# Patient Record
Sex: Female | Born: 1985 | Race: Black or African American | Hispanic: No | Marital: Single | Smoking: Former smoker
Health system: Southern US, Community
[De-identification: ages and names within clinical notes are randomized; demographics above are authoritative.]

## PROBLEM LIST (undated history)

## (undated) DIAGNOSIS — N83209 Unspecified ovarian cyst, unspecified side: Secondary | ICD-10-CM

## (undated) HISTORY — PX: OOPHORECTOMY: SHX86

## (undated) HISTORY — PX: INDUCED ABORTION: SHX677

---
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
## 2008-02-01 ENCOUNTER — Emergency Department: Payer: Self-pay | Admitting: Emergency Medicine

## 2009-10-04 ENCOUNTER — Emergency Department: Payer: Self-pay | Admitting: Emergency Medicine

## 2010-05-31 ENCOUNTER — Emergency Department: Payer: Self-pay | Admitting: Emergency Medicine

## 2013-08-25 ENCOUNTER — Emergency Department: Payer: Self-pay | Admitting: Emergency Medicine

## 2013-08-25 LAB — URINALYSIS, COMPLETE
BACTERIA: NONE SEEN
RBC,UR: 10347 /HPF (ref 0–5)
Specific Gravity: 1.03 (ref 1.003–1.030)
Squamous Epithelial: 95

## 2013-08-25 LAB — BASIC METABOLIC PANEL
ANION GAP: 6 — AB (ref 7–16)
BUN: 10 mg/dL (ref 7–18)
Calcium, Total: 9.6 mg/dL (ref 8.5–10.1)
Chloride: 107 mmol/L (ref 98–107)
Co2: 26 mmol/L (ref 21–32)
Creatinine: 0.93 mg/dL (ref 0.60–1.30)
EGFR (African American): >60
EGFR (Non-African Amer.): >60
Glucose: 90 mg/dL (ref 65–99)
OSMOLALITY: 276 (ref 275–301)
Potassium: 4 mmol/L (ref 3.5–5.1)
SODIUM: 139 mmol/L (ref 136–145)

## 2013-08-25 LAB — CBC
HCT: 39.9 % (ref 35.0–47.0)
HGB: 13 g/dL (ref 12.0–16.0)
MCH: 28.6 pg (ref 26.0–34.0)
MCHC: 32.6 g/dL (ref 32.0–36.0)
MCV: 88 fL (ref 80–100)
PLATELETS: 176 10*3/uL (ref 150–440)
RBC: 4.54 10*6/uL (ref 3.80–5.20)
RDW: 12.8 % (ref 11.5–14.5)
WBC: 6.7 10*3/uL (ref 3.6–11.0)

## 2013-08-25 LAB — GC/CHLAMYDIA PROBE AMP

## 2013-08-25 LAB — WET PREP, GENITAL

## 2013-08-27 LAB — URINE CULTURE

## 2014-04-15 ENCOUNTER — Emergency Department: Payer: Self-pay | Admitting: Emergency Medicine

## 2014-06-06 ENCOUNTER — Emergency Department
Admit: 2014-06-06 | Disposition: A | Discharge status: Home / Self Care | Payer: Self-pay | Admitting: Emergency Medicine

## 2014-06-06 LAB — CBC WITH DIFFERENTIAL/PLATELET
BASOS ABS: 0.1 10*3/uL (ref 0.0–0.1)
Basophil %: 0.6 %
Eosinophil #: 0.1 10*3/uL (ref 0.0–0.7)
Eosinophil %: 1.3 %
HCT: 38.8 % (ref 35.0–47.0)
HGB: 13 g/dL (ref 12.0–16.0)
LYMPHS ABS: 1.7 10*3/uL (ref 1.0–3.6)
Lymphocyte %: 19.2 %
MCH: 29.1 pg (ref 26.0–34.0)
MCHC: 33.4 g/dL (ref 32.0–36.0)
MCV: 87 fL (ref 80–100)
MONOS PCT: 5.4 %
Monocyte #: 0.5 x10 3/mm (ref 0.2–0.9)
NEUTROS PCT: 73.5 %
Neutrophil #: 6.7 10*3/uL — ABNORMAL HIGH (ref 1.4–6.5)
PLATELETS: 197 10*3/uL (ref 150–440)
RBC: 4.45 10*6/uL (ref 3.80–5.20)
RDW: 13 % (ref 11.5–14.5)
WBC: 9.1 10*3/uL (ref 3.6–11.0)

## 2014-06-06 LAB — URINALYSIS, COMPLETE
BACTERIA: NONE SEEN
BILIRUBIN, UR: NEGATIVE
Blood: NEGATIVE
Glucose,UR: NEGATIVE mg/dL (ref 0–75)
Leukocyte Esterase: NEGATIVE
Nitrite: NEGATIVE
Ph: 6 (ref 4.5–8.0)
Protein: NEGATIVE
RBC,UR: 3 /HPF (ref 0–5)
SPECIFIC GRAVITY: 1.028 (ref 1.003–1.030)
WBC UR: 1 /HPF (ref 0–5)

## 2014-06-06 LAB — WET PREP, GENITAL

## 2014-06-06 LAB — PREGNANCY, URINE: Pregnancy Test, Urine: NEGATIVE m[IU]/mL

## 2014-06-06 LAB — LIPASE, BLOOD: Lipase: 51 U/L

## 2014-06-07 LAB — COMPREHENSIVE METABOLIC PANEL
ALBUMIN: 4.4 g/dL
ALT: 16 U/L
AST: 20 U/L
Alkaline Phosphatase: 41 U/L
Anion Gap: 5 — ABNORMAL LOW (ref 7–16)
BUN: 13 mg/dL
Bilirubin,Total: 0.5 mg/dL
CHLORIDE: 103 mmol/L
Calcium, Total: 8.9 mg/dL
Co2: 25 mmol/L
Creatinine: 0.7 mg/dL
EGFR (African American): >60
Glucose: 106 mg/dL — ABNORMAL HIGH
POTASSIUM: 3.8 mmol/L
SODIUM: 133 mmol/L — AB
Total Protein: 7.8 g/dL

## 2014-06-07 LAB — GC/CHLAMYDIA PROBE AMP

## 2014-08-25 ENCOUNTER — Other Ambulatory Visit: Payer: Self-pay | Admitting: Family Medicine

## 2014-08-25 DIAGNOSIS — R102 Pelvic and perineal pain: Secondary | ICD-10-CM

## 2014-08-30 ENCOUNTER — Ambulatory Visit
Admission: RE | Admit: 2014-08-30 | Discharge: 2014-08-30 | Disposition: A | Discharge status: Home / Self Care | Payer: BC Managed Care – PPO | Source: Ambulatory Visit | Attending: Family Medicine | Admitting: Family Medicine

## 2014-08-30 DIAGNOSIS — N8320 Unspecified ovarian cysts: Secondary | ICD-10-CM | POA: Diagnosis not present

## 2014-08-30 DIAGNOSIS — R1904 Left lower quadrant abdominal swelling, mass and lump: Secondary | ICD-10-CM | POA: Diagnosis not present

## 2014-08-30 DIAGNOSIS — R102 Pelvic and perineal pain: Secondary | ICD-10-CM | POA: Diagnosis present

## 2014-09-14 ENCOUNTER — Encounter: Payer: Self-pay | Admitting: Emergency Medicine

## 2014-09-14 ENCOUNTER — Emergency Department
Admission: EM | Admit: 2014-09-14 | Discharge: 2014-09-14 | Disposition: A | Discharge status: Home / Self Care | Payer: BC Managed Care – PPO | Attending: Emergency Medicine | Admitting: Emergency Medicine

## 2014-09-14 DIAGNOSIS — N8320 Unspecified ovarian cysts: Secondary | ICD-10-CM | POA: Diagnosis not present

## 2014-09-14 DIAGNOSIS — R109 Unspecified abdominal pain: Secondary | ICD-10-CM | POA: Diagnosis present

## 2014-09-14 DIAGNOSIS — N83202 Unspecified ovarian cyst, left side: Secondary | ICD-10-CM

## 2014-09-14 HISTORY — DX: Unspecified ovarian cyst, unspecified side: N83.209

## 2014-09-14 MED ORDER — KETOROLAC TROMETHAMINE 30 MG/ML IJ SOLN
30.0000 mg | Freq: Once | INTRAMUSCULAR | Status: AC
  Administered 2014-09-14: 30 mg via INTRAVENOUS
  Filled 2014-09-14: qty 1

## 2014-09-14 MED ORDER — HYDROMORPHONE HCL 1 MG/ML IJ SOLN
1.0000 mg | Freq: Once | INTRAMUSCULAR | Status: AC
  Administered 2014-09-14: 1 mg via INTRAVENOUS
  Filled 2014-09-14: qty 1

## 2014-09-14 MED ORDER — HYDROMORPHONE HCL 1 MG/ML IJ SOLN
0.5000 mg | Freq: Once | INTRAMUSCULAR | Status: AC
  Administered 2014-09-14: 0.5 mg via INTRAVENOUS

## 2014-09-14 MED ORDER — OXYCODONE-ACETAMINOPHEN 5-325 MG PO TABS
2.0000 | ORAL_TABLET | Freq: Once | ORAL | Status: AC
  Administered 2014-09-14: 2 via ORAL

## 2014-09-14 MED ORDER — OXYCODONE-ACETAMINOPHEN 5-325 MG PO TABS
ORAL_TABLET | ORAL | Status: AC
  Filled 2014-09-14: qty 2

## 2014-09-14 MED ORDER — HYDROMORPHONE HCL 1 MG/ML IJ SOLN
INTRAMUSCULAR | Status: AC
  Administered 2014-09-14: 0.5 mg via INTRAVENOUS
  Filled 2014-09-14: qty 1

## 2014-09-14 MED ORDER — OXYCODONE-ACETAMINOPHEN 5-325 MG PO TABS: 1.0000 | ORAL_TABLET | Freq: Three times a day (TID) | ORAL | Status: AC | PRN

## 2014-09-14 MED ORDER — ONDANSETRON HCL 4 MG/2ML IJ SOLN
4.0000 mg | Freq: Once | INTRAMUSCULAR | Status: AC
  Administered 2014-09-14: 4 mg via INTRAVENOUS
  Filled 2014-09-14: qty 2

## 2014-09-14 NOTE — ED Notes (Signed)
Pt reports hx of ovarian cyst, reports US 2 weeks ago, scheduled for another on August 29th. Pt reports left lower abdominal pain; pt reports LMP was Friday, reports increased pain x3 days. Pt contacted [facility]CP and [facility], was told to come here. Pt started on tramadol with no relief.

## 2014-09-14 NOTE — ED Provider Notes (Signed)
[facility] Emergency Department Provider Note     Time seen: ----------------------------------------- 6:12 PM on 09/14/2014 -----------------------------------------    I have reviewed the triage vital signs and the nursing notes.   HISTORY  Chief Complaint Abdominal Pain    HPI Frances Kelly is a 29 y.o. female who presents ER for known ovarian cyst and persistent pelvic pain. Patient reports she knows what the problem is, she has followed up with OB/GYN since her last visit here and they are going to start her on hormones this next Sunday. Patient denies any fevers chills or other complaints. Does have some nausea associated with it. States the tramadol was not really helping that she is taking at home.   Past Medical History  Diagnosis Date  . Ovarian cyst     There are no active problems to display for this patient.   History reviewed. No pertinent past surgical history.  Allergies Review of patient's allergies indicates no known allergies.  Social History History  Substance Use Topics  . Smoking status: Never Smoker   . Smokeless tobacco: Not on file  . Alcohol Use: No    Review of Systems Constitutional: Negative for fever. Eyes: Negative for visual changes. ENT: Negative for sore throat. Cardiovascular: Negative for chest pain. Respiratory: Negative for shortness of breath. Gastrointestinal: Negative for abdominal pain, vomiting and diarrhea. Genitourinary: Negative for dysuria. Positive for pelvic pain Musculoskeletal: Negative for back pain. Skin: Negative for rash. Neurological: Negative for headaches, focal weakness or numbness.  10-point ROS otherwise negative.  ____________________________________________   PHYSICAL EXAM:  VITAL SIGNS: ED Triage Vitals  Enc Vitals Group     BP 09/14/14 1745 143/98 mmHg     Pulse Rate 09/14/14 1745 114     Resp 09/14/14 1745 16     Temp 09/14/14 1745 98.4 F (36.9 C)      Temp Source 09/14/14 1745 Oral     SpO2 09/14/14 1745 98 %     Weight 09/14/14 1745 170 lb (77.111 kg)     Height 09/14/14 1745 5\' 7"  (1.702 m)     Head Cir --      Peak Flow --      Pain Score 09/14/14 1745 10     Pain Loc --      Pain Edu? --      Excl. in GC? --     Constitutional: Alert and oriented. Anxious, well appearing and in no distress. Eyes: Conjunctivae are normal. PERRL. Normal extraocular movements. ENT   Head: Normocephalic and atraumatic.   Nose: No congestion/rhinnorhea.   Mouth/Throat: Mucous membranes are moist.   Neck: No stridor. Hematological/Lymphatic/Immunilogical: No cervical lymphadenopathy. Cardiovascular: Normal rate, regular rhythm. Normal and symmetric distal pulses are present in all extremities. No murmurs, rubs, or gallops. Respiratory: Normal respiratory effort without tachypnea nor retractions. Breath sounds are clear and equal bilaterally. No wheezes/rales/rhonchi. Gastrointestinal: Left lower quadrant and left hemipelvic tenderness. There is no CVA tenderness. Musculoskeletal: Nontender with normal range of motion in all extremities. No joint effusions.  No lower extremity tenderness nor edema. Neurologic:  Normal speech and language. No gross focal neurologic deficits are appreciated. Speech is normal. No gait instability. Skin:  Skin is warm, dry and intact. No rash noted. Psychiatric: Mood and affect are normal. Speech and behavior are normal. Patient exhibits appropriate insight and judgment.  ____________________________________________  ED COURSE:  Pertinent labs & imaging results that were available during my care of the patient were reviewed by me  and considered in my medical decision making (see chart for details). Patient will receive IV Dilaudid, Toradol, Zofran. I will review her ultrasound. ____________________________________________   RADIOLOGY Previous ultrasound results were reviewed.   ____________________________________________  FINAL ASSESSMENT AND PLAN  Ovarian cysts, chronic pelvic pain  Plan: Patient was given the above medications for pain control. Will have the patient continue with outpatient GYN follow-up. We'll increase her pain medicine to Percocets.   Emily Filbert, MD   Emily Filbert, MD 09/14/14 [id number]

## 2014-09-14 NOTE — ED Notes (Signed)
Spoke with Dr Mayford KnifeWilliams about pt's condition; verbal orders for 10mg  percocet PO.

## 2014-09-14 NOTE — ED Notes (Signed)
Pt has known ovarian cyst, pt states the pain began this afternoon, pt is to start birth control Sunday, pt tearful and anxious, rocking in bed

## 2014-09-14 NOTE — Discharge Instructions (Signed)
Ovarian Cyst An ovarian cyst is a fluid-filled sac that forms on an ovary. The ovaries are small organs that produce eggs in women. Various types of cysts can form on the ovaries. Most are not cancerous. Many do not cause problems, and they often go away on their own. Some may cause symptoms and require treatment. Common types of ovarian cysts include:  Functional cysts--These cysts may occur every month during the menstrual cycle. This is normal. The cysts usually go away with the next menstrual cycle if the woman does not get pregnant. Usually, there are no symptoms with a functional cyst.  Endometrioma cysts--These cysts form from the tissue that lines the uterus. They are also called "chocolate cysts" because they become filled with blood that turns brown. This type of cyst can cause pain in the lower abdomen during intercourse and with your menstrual period.  Cystadenoma cysts--This type develops from the cells on the outside of the ovary. These cysts can get very big and cause lower abdomen pain and pain with intercourse. This type of cyst can twist on itself, cut off its blood supply, and cause severe pain. It can also easily rupture and cause a lot of pain.  Dermoid cysts--This type of cyst is sometimes found in both ovaries. These cysts may contain different kinds of body tissue, such as skin, teeth, hair, or cartilage. They usually do not cause symptoms unless they get very big.  Theca lutein cysts--These cysts occur when too much of a certain hormone (human chorionic gonadotropin) is produced and overstimulates the ovaries to produce an egg. This is most common after procedures used to assist with the conception of a baby (in vitro fertilization). CAUSES   Fertility drugs can cause a condition in which multiple large cysts are formed on the ovaries. This is called ovarian hyperstimulation syndrome.  A condition called polycystic ovary syndrome can cause hormonal imbalances that can lead to  nonfunctional ovarian cysts. SIGNS AND SYMPTOMS  Many ovarian cysts do not cause symptoms. If symptoms are present, they may include:  Pelvic pain or pressure.  Pain in the lower abdomen.  Pain during sexual intercourse.  Increasing girth (swelling) of the abdomen.  Abnormal menstrual periods.  Increasing pain with menstrual periods.  Stopping having menstrual periods without being pregnant. DIAGNOSIS  These cysts are commonly found during a routine or annual pelvic exam. Tests may be ordered to find out more about the cyst. These tests may include:  Ultrasound.  X-ray of the pelvis.  CT scan.  MRI.  Blood tests. TREATMENT  Many ovarian cysts go away on their own without treatment. Your health care provider may want to check your cyst regularly for 2-3 months to see if it changes. For women in menopause, it is particularly important to monitor a cyst closely because of the higher rate of ovarian cancer in menopausal women. When treatment is needed, it may include any of the following:  A procedure to drain the cyst (aspiration). This may be done using a long needle and ultrasound. It can also be done through a laparoscopic procedure. This involves using a thin, lighted tube with a tiny camera on the end (laparoscope) inserted through a small incision.  Surgery to remove the whole cyst. This may be done using laparoscopic surgery or an open surgery involving a larger incision in the lower abdomen.  Hormone treatment or birth control pills. These methods are sometimes used to help dissolve a cyst. HOME CARE INSTRUCTIONS   Only take over-the-counter   or prescription medicines as directed by your health care provider.  Follow up with your health care provider as directed.  Get regular pelvic exams and Pap tests. SEEK MEDICAL CARE IF:   Your periods are late, irregular, or painful, or they stop.  Your pelvic pain or abdominal pain does not go away.  Your abdomen becomes  larger or swollen.  You have pressure on your bladder or trouble emptying your bladder completely.  You have pain during sexual intercourse.  You have feelings of fullness, pressure, or discomfort in your stomach.  You lose weight for no apparent reason.  You feel generally ill.  You become constipated.  You lose your appetite.  You develop acne.  You have an increase in body and facial hair.  You are gaining weight, without changing your exercise and eating habits.  You think you are pregnant. SEEK IMMEDIATE MEDICAL CARE IF:   You have increasing abdominal pain.  You feel sick to your stomach (nauseous), and you throw up (vomit).  You develop a fever that comes on suddenly.  You have abdominal pain during a bowel movement.  Your menstrual periods become heavier than usual. MAKE SURE YOU:  Understand these instructions.  Will watch your condition.  Will get help right away if you are not doing well or get worse. Document Released: 02/18/2005 Document Revised: 02/23/2013 Document Reviewed: 10/26/2012 Exit[employer] Patient Information 2015 [employer]. This information is not intended to replace advice given to you by your health care provider. Make sure you discuss any questions you have with your health care provider.  

## 2014-10-05 ENCOUNTER — Encounter
Admission: RE | Admit: 2014-10-05 | Discharge: 2014-10-05 | Disposition: A | Discharge status: Home / Self Care | Payer: BC Managed Care – PPO | Source: Ambulatory Visit | Attending: Obstetrics and Gynecology | Admitting: Obstetrics and Gynecology

## 2014-10-05 DIAGNOSIS — K661 Hemoperitoneum: Secondary | ICD-10-CM | POA: Diagnosis not present

## 2014-10-05 DIAGNOSIS — N8320 Unspecified ovarian cysts: Secondary | ICD-10-CM | POA: Diagnosis present

## 2014-10-05 DIAGNOSIS — N809 Endometriosis, unspecified: Secondary | ICD-10-CM | POA: Diagnosis not present

## 2014-10-05 LAB — TYPE AND SCREEN
ABO/RH(D): O POS
ANTIBODY SCREEN: NEGATIVE

## 2014-10-05 LAB — ABO/RH: ABO/RH(D): O POS

## 2014-10-05 NOTE — Patient Instructions (Addendum)
  Your procedure is scheduled on: 10/06/14 Thurs arrival time 9:40 am Report to Day Surgery. To find out your arrival time please call [phone] between 1PM - 3PM on 10/05/14 Wed.  Remember: Instructions that are not followed completely may result in serious medical risk, up to and including death, or upon the discretion of your surgeon and anesthesiologist your surgery may need to be rescheduled.    _x___ 1. Do not eat food or drink liquids after midnight. No gum chewing or hard candies.     __x__ 2. No Alcohol for 24 hours before or after surgery.   ____ 3. Bring all medications with you on the day of surgery if instructed.    __x__ 4. Notify your doctor if there is any change in your medical condition     (cold, fever, infections).     Do not wear jewelry, make-up, hairpins, clips or nail polish.  Do not wear lotions, powders, or perfumes. You may wear deodorant.  Do not shave 48 hours prior to surgery. Men may shave face and neck.  Do not bring valuables to the hospital.    [facility] is not responsible for any belongings or valuables.               Contacts, dentures or bridgework may not be worn into surgery.  Leave your suitcase in the car. After surgery it may be brought to your room.  For patients admitted to the hospital, discharge time is determined by your                treatment team.   Patients discharged the day of surgery will not be allowed to drive home.   Please read over the following fact sheets that you were given:      __x__ Take these medicines the morning of surgery with A SIP OF WATER:    1. oxyCODONE-acetaminophen (ROXICET) 5-325 MG per tablet  2.   3.   4.  5.  6.  ____ Fleet Enema (as directed)   __x__ Use CHG Soap as directed  ____ Use inhalers on the day of surgery  ____ Stop metformin 2 days prior to surgery    ____ Take 1/2 of usual insulin dose the night before surgery and none on the morning of surgery.   ____ Stop  Coumadin/Plavix/aspirin on   ____ Stop Anti-inflammatories on    ____ Stop supplements until after surgery.    ____ Bring C-Pap to the hospital.

## 2014-10-06 ENCOUNTER — Ambulatory Visit
Admission: RE | Admit: 2014-10-06 | Discharge: 2014-10-06 | Disposition: A | Discharge status: Home / Self Care | Payer: BC Managed Care – PPO | Source: Ambulatory Visit | Attending: Obstetrics and Gynecology | Admitting: Obstetrics and Gynecology

## 2014-10-06 ENCOUNTER — Ambulatory Visit: Payer: BC Managed Care – PPO | Admitting: Certified Registered Nurse Anesthetist

## 2014-10-06 ENCOUNTER — Encounter
Admission: RE | Disposition: A | Discharge status: Home / Self Care | Payer: Self-pay | Source: Ambulatory Visit | Attending: Obstetrics and Gynecology

## 2014-10-06 ENCOUNTER — Ambulatory Visit: Payer: BC Managed Care – PPO | Admitting: *Deleted

## 2014-10-06 DIAGNOSIS — N809 Endometriosis, unspecified: Secondary | ICD-10-CM | POA: Insufficient documentation

## 2014-10-06 DIAGNOSIS — K661 Hemoperitoneum: Secondary | ICD-10-CM | POA: Insufficient documentation

## 2014-10-06 HISTORY — PX: LAPAROSCOPY: SHX197

## 2014-10-06 LAB — POCT PREGNANCY, URINE: PREG TEST UR: NEGATIVE

## 2014-10-06 SURGERY — LAPAROSCOPY, DIAGNOSTIC
Anesthesia: Choice | Laterality: Left

## 2014-10-06 MED ORDER — FENTANYL CITRATE (PF) 100 MCG/2ML IJ SOLN
25.0000 ug | INTRAMUSCULAR | Status: AC | PRN
  Administered 2014-10-06 (×6): 25 ug via INTRAVENOUS

## 2014-10-06 MED ORDER — LIDOCAINE HCL (PF) 1 % IJ SOLN
INTRAMUSCULAR | Status: AC
  Administered 2014-10-06: 10:00:00
  Filled 2014-10-06: qty 2

## 2014-10-06 MED ORDER — FAMOTIDINE 20 MG PO TABS
20.0000 mg | ORAL_TABLET | Freq: Once | ORAL | Status: AC
  Administered 2014-10-06: 20 mg via ORAL

## 2014-10-06 MED ORDER — LIDOCAINE HCL (CARDIAC) 20 MG/ML IV SOLN
INTRAVENOUS | Status: DC | PRN
  Administered 2014-10-06 (×2): 100 mg via INTRAVENOUS

## 2014-10-06 MED ORDER — MEPERIDINE HCL 25 MG/ML IJ SOLN
INTRAMUSCULAR | Status: AC
  Administered 2014-10-06: 12:00:00
  Filled 2014-10-06: qty 1

## 2014-10-06 MED ORDER — FENTANYL CITRATE (PF) 100 MCG/2ML IJ SOLN
INTRAMUSCULAR | Status: AC
  Administered 2014-10-06: 25 ug via INTRAVENOUS
  Filled 2014-10-06: qty 2

## 2014-10-06 MED ORDER — DEXAMETHASONE SODIUM PHOSPHATE 4 MG/ML IJ SOLN
INTRAMUSCULAR | Status: DC | PRN
  Administered 2014-10-06: 5 mg via INTRAVENOUS

## 2014-10-06 MED ORDER — FENTANYL CITRATE (PF) 100 MCG/2ML IJ SOLN
INTRAMUSCULAR | Status: AC
Start: 2014-10-06 — End: 2014-10-06
  Administered 2014-10-06: 25 ug via INTRAVENOUS
  Filled 2014-10-06: qty 2

## 2014-10-06 MED ORDER — FENTANYL CITRATE (PF) 100 MCG/2ML IJ SOLN
INTRAMUSCULAR | Status: DC | PRN
  Administered 2014-10-06: 150 ug via INTRAVENOUS
  Administered 2014-10-06: 100 ug via INTRAVENOUS

## 2014-10-06 MED ORDER — OXYCODONE-ACETAMINOPHEN 5-325 MG PO TABS: 1.0000 | ORAL_TABLET | Freq: Four times a day (QID) | ORAL | Status: AC | PRN

## 2014-10-06 MED ORDER — ONDANSETRON HCL 4 MG/2ML IJ SOLN
INTRAMUSCULAR | Status: DC | PRN
  Administered 2014-10-06: 4 mg via INTRAVENOUS

## 2014-10-06 MED ORDER — GLYCOPYRROLATE 0.2 MG/ML IJ SOLN
INTRAMUSCULAR | Status: DC | PRN
  Administered 2014-10-06: .8 mg via INTRAVENOUS

## 2014-10-06 MED ORDER — ROCURONIUM BROMIDE 100 MG/10ML IV SOLN
INTRAVENOUS | Status: DC | PRN
  Administered 2014-10-06: 40 mg via INTRAVENOUS

## 2014-10-06 MED ORDER — NEOSTIGMINE METHYLSULFATE 10 MG/10ML IV SOLN
INTRAVENOUS | Status: DC | PRN
  Administered 2014-10-06: 5 mg via INTRAVENOUS

## 2014-10-06 MED ORDER — MIDAZOLAM HCL 2 MG/2ML IJ SOLN
INTRAMUSCULAR | Status: DC | PRN
  Administered 2014-10-06: 2 mg via INTRAVENOUS

## 2014-10-06 MED ORDER — BUPIVACAINE HCL (PF) 0.5 % IJ SOLN
INTRAMUSCULAR | Status: AC
  Filled 2014-10-06: qty 30

## 2014-10-06 MED ORDER — KETOROLAC TROMETHAMINE 30 MG/ML IJ SOLN
INTRAMUSCULAR | Status: DC | PRN
  Administered 2014-10-06: 30 mg via INTRAVENOUS

## 2014-10-06 MED ORDER — PROPOFOL 10 MG/ML IV BOLUS
INTRAVENOUS | Status: DC | PRN
  Administered 2014-10-06: 150 mg via INTRAVENOUS

## 2014-10-06 MED ORDER — BUPIVACAINE HCL 0.5 % IJ SOLN
INTRAMUSCULAR | Status: DC | PRN
  Administered 2014-10-06: 8 mL

## 2014-10-06 MED ORDER — MEPERIDINE HCL 25 MG/ML IJ SOLN
12.5000 mg | Freq: Once | INTRAMUSCULAR | Status: AC
  Administered 2014-10-06: 12.5 mg via INTRAVENOUS

## 2014-10-06 MED ORDER — LACTATED RINGERS IV SOLN
INTRAVENOUS | Status: DC
  Administered 2014-10-06: 10:00:00 via INTRAVENOUS

## 2014-10-06 MED ORDER — DOCUSATE SODIUM 100 MG PO CAPS: 100.0000 mg | ORAL_CAPSULE | Freq: Two times a day (BID) | ORAL | Status: AC

## 2014-10-06 MED ORDER — FAMOTIDINE 20 MG PO TABS
ORAL_TABLET | ORAL | Status: AC
  Filled 2014-10-06: qty 1

## 2014-10-06 SURGICAL SUPPLY — 41 items
APPLICATOR COTTON TIP 6IN STRL (MISCELLANEOUS) ×3 IMPLANT
BAG URO DRAIN 2000ML W/SPOUT (MISCELLANEOUS) ×3 IMPLANT
BLADE SURG SZ11 CARB STEEL (BLADE) ×3 IMPLANT
CANISTER SUCT 1200ML W/VALVE (MISCELLANEOUS) ×3 IMPLANT
CATH FOLEY 2WAY  5CC 16FR (CATHETERS) ×2
CATH URTH 16FR FL 2W BLN LF (CATHETERS) ×1 IMPLANT
CHLORAPREP W/TINT 26ML (MISCELLANEOUS) ×3 IMPLANT
CORD MONOPOLAR M/FML 12FT (MISCELLANEOUS) ×3 IMPLANT
DRSG TEGADERM 2-3/8X2-3/4 SM (GAUZE/BANDAGES/DRESSINGS) ×3 IMPLANT
GAUZE SPONGE NON-WVN 2X2 STRL (MISCELLANEOUS) ×1 IMPLANT
GLOVE BIO SURGEON STRL SZ7 (GLOVE) ×12 IMPLANT
GLOVE BIOGEL PI IND STRL 7.5 (GLOVE) ×4 IMPLANT
GLOVE BIOGEL PI INDICATOR 7.5 (GLOVE) ×8
GOWN STRL REUS W/ TWL LRG LVL3 (GOWN DISPOSABLE) ×1 IMPLANT
GOWN STRL REUS W/ TWL XL LVL3 (GOWN DISPOSABLE) ×1 IMPLANT
GOWN STRL REUS W/TWL LRG LVL3 (GOWN DISPOSABLE) ×2
GOWN STRL REUS W/TWL XL LVL3 (GOWN DISPOSABLE) ×2
IRRIGATION STRYKERFLOW (MISCELLANEOUS) IMPLANT
IRRIGATOR STRYKERFLOW (MISCELLANEOUS)
IV LACTATED RINGERS 1000ML (IV SOLUTION) ×3 IMPLANT
KIT RM TURNOVER CYSTO AR (KITS) ×3 IMPLANT
LABEL OR SOLS (LABEL) ×3 IMPLANT
LIGASURE 5MM LAPAROSCOPIC (INSTRUMENTS) IMPLANT
LIQUID BAND (GAUZE/BANDAGES/DRESSINGS) ×3 IMPLANT
NS IRRIG 500ML POUR BTL (IV SOLUTION) ×3 IMPLANT
PACK GYN LAPAROSCOPIC (MISCELLANEOUS) ×3 IMPLANT
PAD GROUND ADULT SPLIT (MISCELLANEOUS) ×3 IMPLANT
PAD OB MATERNITY 4.3X12.25 (PERSONAL CARE ITEMS) ×3 IMPLANT
PAD PREP 24X41 OB/GYN DISP (PERSONAL CARE ITEMS) ×3 IMPLANT
POUCH ENDO CATCH 10MM SPEC (MISCELLANEOUS) IMPLANT
SCISSORS METZENBAUM CVD 33 (INSTRUMENTS) ×3 IMPLANT
SLEEVE ENDOPATH XCEL 5M (ENDOMECHANICALS) ×6 IMPLANT
SPONGE VERSALON 2X2 STRL (MISCELLANEOUS) ×2
SUT MNCRL 4-0 (SUTURE) ×2
SUT MNCRL 4-0 27XMFL (SUTURE) ×1
SUT VICRYL 0 AB UR-6 (SUTURE) ×3 IMPLANT
SUTURE MNCRL 4-0 27XMF (SUTURE) ×1 IMPLANT
SYRINGE 10CC LL (SYRINGE) ×3 IMPLANT
TROCAR BLUNT TIP 12MM OMST12BT (TROCAR) ×3 IMPLANT
TROCAR XCEL NON-BLD 5MMX100MML (ENDOMECHANICALS) ×3 IMPLANT
TUBING INSUFFLATOR HI FLOW (MISCELLANEOUS) ×3 IMPLANT

## 2014-10-06 NOTE — Pre-Procedure Instructions (Signed)
Pt reports abdominal pain is down to a 1/10.

## 2014-10-06 NOTE — Op Note (Signed)
Operative Note   10/06/2014  PRE-OP DIAGNOSIS: Left lower quadrant pain. 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 Ruptured left ovarian cyst. +hemoperitoneum. Endometriosis   SURGEON: Surgeon(s) and Role:    * Coleta Bing, MD - Primary  ASSISTANT:   Vena Austria, MD   PROCEDURE: Diagnostic laparoscopy  ANESTHESIA: General and local  ESTIMATED BLOOD LOSS: 5mL  DRAINS: indwelling foley UOP   TOTAL IV FLUIDS: crystalloid  VTE PROPHYLAXIS: SCDs to the bilateral lower extremities  ANTIBIOTICS: not indicated  SPECIMENS: none  DISPOSITION: PACU - hemodynamically stable.  CONDITION: stable  COMPLICATIONS: None  FINDINGS: Normal EGBUS, vagina and cervix, with uterus sounding to 8.5cm. On diagnostic laparoscopy, normal appendix, liver and stomach edge and no intra-abdominal adhesions noted except for colon adhesions to the left pelvic brim, in the area of the left IP ligament. Grossly normal uterus and bilateral tubes and ovaries and the anterior cul de sac and the bilateral para-ovarian fossae. In the posterior cul de sac was approximately 50mL of old blood. On the left ovary was an approximately 0.5cm opening into the ovary (hemostatic). Adhesions and slight shortening and distortion was seen in the posterior cul de sac in the area of the utero-sacrals.    DESCRIPTION OF PROCEDURE: After informed consent was obtained, the patient was taken to the operating room where anesthesia was obtained without difficulty. The patient was positioned in the dorsal lithotomy position in [place] stirrups and her arms were carefully tucked at her sides and the usual precautions were taken.  She was prepped and draped in normal sterile fashion.  Time-out was performed and a Foley catheter was placed into the bladder. A standard Hulka uterine manipulator was then placed in the uterus without incident. Gloves were then changed, and after injection of local  anesthesia, the open technique was used to place an infraumbilical 12-mm baloon trocar under direct visualization. The laparoscope was introduced and CO2 gas was infused for pneumoperitoneum to a pressure of 15 mm Hg and the area below inspected for injury.  The patient was placed in Trendelenburg and the bowel was displaced up into the upper abdomen, and the right and left lateral 5-mm ports were placed under direct visualization of the laparoscope, after injection of local anesthesia.  The above noted findings were observed and the old blood removed with suction.  The right and left lateral ports were then removed under direct visualization and the umbilical port removed after releasing the pneumoperitneum.  The fascia at the umbilicus was closed with a figure of eight 0-vicryl stitch and the skin closed with 4-0 monocryl in a subcuticular fashion. The remaining skin incisions were closed with Liqui-band glue.  The patient tolerated the procedure well.  Sponge, lap and needle counts were correct x2.  The patient was taken to recovery room in excellent condition.  Cornelia Copa MD [facility]  Pager: [phone]

## 2014-10-06 NOTE — Anesthesia Procedure Notes (Signed)
Procedure Name: Intubation Date/Time: 10/06/2014 12:30 PM Performed by: Shirlee Limerick, Myli Pae Pre-anesthesia Checklist: Patient identified Patient Re-evaluated:Patient Re-evaluated prior to inductionOxygen Delivery Method: Circle system utilized Preoxygenation: Pre-oxygenation with 100% oxygen Intubation Type: IV induction Laryngoscope Size: Mac and 3 Grade View: Grade I Tube type: Oral Tube size: 7.0 mm Number of attempts: 1 Secured at: 21 cm Tube secured with: Tape Dental Injury: Teeth and Oropharynx as per pre-operative assessment

## 2014-10-06 NOTE — Transfer of Care (Signed)
Immediate Anesthesia Transfer of Care Note  Patient: Frances Kelly  Procedure(s) Performed: Procedure(s): LAPAROSCOPY DIAGNOSTIC (Left)  Patient Location: PACU  Anesthesia Type:General  Level of Consciousness: sedated  Airway & Oxygen Therapy: Patient Spontanous Breathing and Patient connected to nasal cannula oxygen  Post-op Assessment: Report given to RN and Post -op Vital signs reviewed and stable  Post vital signs: Reviewed and stable  Last Vitals:  Filed Vitals:   10/06/14 1345  BP: 126/81  Pulse: 75  Temp: 36.2 C  Resp: 13    Complications: No apparent anesthesia complications

## 2014-10-06 NOTE — H&P (Signed)
GYN H&P H&P reviewed and patient with pain still and will proceed with l/s left ovarian cystectomy. Will proceed when OR is ready.  Cornelia Copa MD [facility]  Pager: [phone]

## 2014-10-06 NOTE — Discharge Summary (Signed)
Gynecology Discharge Summary Date of Admission: 10/06/2014 Date of Discharge: 10/06/2014  The patient was admitted, as scheduled, and underwent a diagnostic laparoscopy for LLQ and left ovarian cyst, with rupture noted prior to the surgery during laparoscopy; please refer to operative note for full details.  She was meeting all post op goals and discharged to home from the PACU    Medication List    TAKE these medications        docusate sodium 100 MG capsule  Commonly known as:  COLACE  Take 1 capsule (100 mg total) by mouth 2 (two) times daily.     levonorgestrel-ethinyl estradiol 0.15-0.03 MG tablet  Commonly known as:  SEASONALE,INTROVALE,JOLESSA  Take 1 tablet by mouth daily.     oxyCODONE-acetaminophen 5-325 MG per tablet  Commonly known as:  ROXICET  Take 1 tablet by mouth every 8 (eight) hours as needed.     oxyCODONE-acetaminophen 5-325 MG per tablet  Commonly known as:  ROXICET  Take 1 tablet by mouth every 6 (six) hours as needed for severe pain.      #15 additonial percocet given  The patient was instructed to call for a 3-4 weeks with Dr. Cindy Hazy. MD [facility]

## 2014-10-06 NOTE — Discharge Instructions (Addendum)
[facility] Laparoscopic Surgery Discharge Instructions  Instructions Following Laparoscopic Surgery You have just undergone a major laparoscopic surgery.  The following list should answer your most common questions.  Although we will discuss your surgery and post-operative instructions with you prior to your discharge, this list will serve as a reminder if you fail to recall the details of what we discussed.  We will discuss your surgery once again in detail at your post-op visit in two to four weeks. If you havent already done so, please call to make your appointment as soon as possible.  How you will feel: Although you have just undergone a major surgery, your recovery will be significantly shorter since the surgery was performed through much smaller incisions than the traditional approach.  You should feel slightly better each day.  If you suddenly feel much worse than the prior day, please call the clinic.  Its important during the early part of your recovery that you maintain some activity.  Walking is encouraged.  You will quicken your recovery by continued activity.  Incision:  Your incisions will be closed with dissolvable stitches or surgical adhesive (glue).  There may be Band-aids and/or Steri-strips covering your incisions.  If there is no drainage from the incisions you may remove the Band-aids in one to two days.  You may notice some minor bruising at the incision sites.  This is common and will resolve within several days.  Please inform us if the redness at the edges of your incision appears to be spreading.  If the skin around your incision becomes warm to the touch, or if you notice a pus-like drainage, please call the office.  Sexual Activity Following a Hysterectomy: Do not have sexual intercourse or place tampons or douches in the vagina prior to your first office visit.  We will discuss when you may resume these activities at that visit.    Stairs/Driving/Activities: You may  climb stairs if necessary.  If youve had general anesthesia, do not drive a car the rest of the day today.  You may begin light housework when you feel up to it, but avoid heavy lifting (more than 15-20lbs) or pushing until cleared for these activities by your physician.  Hygiene:  Do not soak your incisions.  Showers are acceptable but you may not take a bath or swim in a pool.  Cleanse your incisions daily with soap and water.  Medications:  Please resume taking any medications that you were taking prior to the surgery.  If we have prescribed any new medications for you, please take them as directed.  Constipation:  It is fairly common to experience some difficulty in moving your bowels following major surgery.  Being active will help to reduce this likelihood. A diet rich in fiber and plenty of liquids is desirable.  If you do become constipated, a mild laxative such as Miralax, Milk of Magnesia, or Metamucil, or a stool softener such as Colace, is recommended.  General Instructions: If you develop a fever of 100.5 degrees or higher, please call the office number(s) below for physician on call.    We will discuss your surgery once again in detail at your post-op visit in two to four weeks. If you havent already done so, please call to make your appointment as soon as possible.  [facility] [place]  [address]  [place], [place] [address] [place], [place] [address]  Phone: [phone] Phone: [phone]  Fax: [phone] Fax: [phone]  AMBULATORY SURGERY  DISCHARGE INSTRUCTIONS   1) The drugs that you were given will stay in your system until tomorrow so for the next 24 hours you should not:  A) Drive an automobile B) Make any legal decisions C) Drink any alcoholic beverage   2) You may resume regular meals tomorrow.  Today it is better to start with liquids and gradually work up to solid foods.  You may eat anything you prefer, but it is  better to start with liquids, then soup and crackers, and gradually work up to solid foods.   3) Please notify your doctor immediately if you have any unusual bleeding, trouble breathing, redness and pain at the surgery site, drainage, fever, or pain not relieved by medication.    4) Additional Instructions:        Please contact your physician with any problems or Same Day Surgery at [phone], Monday through Friday 6 am to 4 pm, or [place] at [facility] number at [phone].

## 2014-10-06 NOTE — Pre-Procedure Instructions (Signed)
PT C/O 3/10 left lower quadrant pain.  Called Dr. Pernell Dupre, received order for 12.5 mg Demerol IVP x 1.  Med given.

## 2014-10-07 ENCOUNTER — Encounter: Payer: Self-pay | Admitting: Obstetrics and Gynecology

## 2014-10-07 NOTE — Anesthesia Preprocedure Evaluation (Signed)
Anesthesia Evaluation  Patient identified by MRN, date of birth, ID band Patient awake    Reviewed: Allergy & Precautions, H&P , NPO status , Patient's Chart, lab work & pertinent test results, reviewed documented beta blocker date and time   Airway Mallampati: II  TM Distance: >3 FB Neck ROM: full    Dental   Pulmonary Current Smoker, former smoker,          Cardiovascular Rate:Normal     Neuro/Psych    GI/Hepatic   Endo/Other    Renal/GU      Musculoskeletal   Abdominal   Peds  Hematology   Anesthesia Other Findings   Reproductive/Obstetrics                             Anesthesia Physical Anesthesia Plan  ASA: II  Anesthesia Plan: General ETT   Post-op Pain Management:    Induction:   Airway Management Planned:   Additional Equipment:   Intra-op Plan:   Post-operative Plan:   Informed Consent: I have reviewed the patients History and Physical, chart, labs and discussed the procedure including the risks, benefits and alternatives for the proposed anesthesia with the patient or authorized representative who has indicated his/her understanding and acceptance.     Plan Discussed with: CRNA  Anesthesia Plan Comments:         Anesthesia Quick Evaluation  

## 2014-10-07 NOTE — Anesthesia Postprocedure Evaluation (Signed)
  Anesthesia Post-op Note  Patient: Frances Kelly  Procedure(s) Performed: Procedure(s): LAPAROSCOPY DIAGNOSTIC (Left)  Anesthesia type:No value filed.  Patient location: PACU  Post pain: Pain level controlled  Post assessment: Post-op Vital signs reviewed, Patient's Cardiovascular Status Stable, Respiratory Function Stable, Patent Airway and No signs of Nausea or vomiting  Post vital signs: Reviewed and stable  Last Vitals:  Filed Vitals:   10/06/14 1522  BP: 123/76  Pulse: 73  Temp:   Resp: 16    Level of consciousness: awake, alert  and patient cooperative  Complications: No apparent anesthesia complications

## 2014-10-10 ENCOUNTER — Observation Stay: Payer: BC Managed Care – PPO | Admitting: Anesthesiology

## 2014-10-10 ENCOUNTER — Observation Stay
Admission: EM | Admit: 2014-10-10 | Discharge: 2014-10-10 | Disposition: A | Discharge status: Home / Self Care | Payer: BC Managed Care – PPO | Attending: Obstetrics and Gynecology | Admitting: Obstetrics and Gynecology

## 2014-10-10 ENCOUNTER — Encounter: Payer: Self-pay | Admitting: *Deleted

## 2014-10-10 ENCOUNTER — Emergency Department: Payer: BC Managed Care – PPO

## 2014-10-10 ENCOUNTER — Encounter
Admission: EM | Disposition: A | Discharge status: Home / Self Care | Payer: Self-pay | Source: Home / Self Care | Attending: Emergency Medicine

## 2014-10-10 DIAGNOSIS — N390 Urinary tract infection, site not specified: Secondary | ICD-10-CM | POA: Insufficient documentation

## 2014-10-10 DIAGNOSIS — Z79899 Other long term (current) drug therapy: Secondary | ICD-10-CM | POA: Insufficient documentation

## 2014-10-10 DIAGNOSIS — R102 Pelvic and perineal pain: Secondary | ICD-10-CM | POA: Diagnosis not present

## 2014-10-10 DIAGNOSIS — N830 Follicular cyst of ovary: Secondary | ICD-10-CM | POA: Diagnosis not present

## 2014-10-10 DIAGNOSIS — R1032 Left lower quadrant pain: Secondary | ICD-10-CM | POA: Diagnosis not present

## 2014-10-10 DIAGNOSIS — R52 Pain, unspecified: Secondary | ICD-10-CM | POA: Diagnosis present

## 2014-10-10 DIAGNOSIS — N83202 Unspecified ovarian cyst, left side: Secondary | ICD-10-CM | POA: Diagnosis present

## 2014-10-10 DIAGNOSIS — R109 Unspecified abdominal pain: Secondary | ICD-10-CM

## 2014-10-10 DIAGNOSIS — Z87891 Personal history of nicotine dependence: Secondary | ICD-10-CM | POA: Insufficient documentation

## 2014-10-10 DIAGNOSIS — R100 Acute abdomen: Secondary | ICD-10-CM | POA: Diagnosis present

## 2014-10-10 DIAGNOSIS — Z9889 Other specified postprocedural states: Secondary | ICD-10-CM

## 2014-10-10 HISTORY — PX: LAPAROSCOPIC OOPHERECTOMY: SHX6507

## 2014-10-10 LAB — URINALYSIS COMPLETE WITH MICROSCOPIC (ARMC ONLY)
Bilirubin Urine: NEGATIVE
GLUCOSE, UA: NEGATIVE mg/dL
Ketones, ur: NEGATIVE mg/dL
Nitrite: NEGATIVE
Protein, ur: 100 mg/dL — AB
Specific Gravity, Urine: 1.023 (ref 1.005–1.030)
pH: 7 (ref 5.0–8.0)

## 2014-10-10 LAB — COMPREHENSIVE METABOLIC PANEL
ALK PHOS: 29 U/L — AB (ref 38–126)
ALT: 11 U/L — AB (ref 14–54)
ANION GAP: 8 (ref 5–15)
AST: 16 U/L (ref 15–41)
Albumin: 3.8 g/dL (ref 3.5–5.0)
BUN: 12 mg/dL (ref 6–20)
CALCIUM: 9 mg/dL (ref 8.9–10.3)
CHLORIDE: 106 mmol/L (ref 101–111)
CO2: 27 mmol/L (ref 22–32)
Creatinine, Ser: 0.89 mg/dL (ref 0.44–1.00)
GFR calc non Af Amer: >60 mL/min (ref >60)
GLUCOSE: 107 mg/dL — AB (ref 65–99)
Potassium: 4.3 mmol/L (ref 3.5–5.1)
Sodium: 141 mmol/L (ref 135–145)
Total Bilirubin: <0.1 mg/dL — ABNORMAL LOW (ref 0.3–1.2)
Total Protein: 7.6 g/dL (ref 6.5–8.1)

## 2014-10-10 LAB — TYPE AND SCREEN
ABO/RH(D): O POS
ANTIBODY SCREEN: NEGATIVE

## 2014-10-10 LAB — CBC
HEMATOCRIT: 37.6 % (ref 35.0–47.0)
HEMOGLOBIN: 13.2 g/dL (ref 12.0–16.0)
MCH: 30.7 pg (ref 26.0–34.0)
MCHC: 35.2 g/dL (ref 32.0–36.0)
MCV: 87.3 fL (ref 80.0–100.0)
Platelets: 205 10*3/uL (ref 150–440)
RBC: 4.3 MIL/uL (ref 3.80–5.20)
RDW: 12.6 % (ref 11.5–14.5)
WBC: 8 10*3/uL (ref 3.6–11.0)

## 2014-10-10 LAB — POCT PREGNANCY, URINE: Preg Test, Ur: NEGATIVE

## 2014-10-10 SURGERY — LAPAROSCOPIC OOPHERECTOMY
Anesthesia: General | Laterality: Left | Wound class: Clean Contaminated

## 2014-10-10 MED ORDER — THROMBIN 5000 UNITS EX SOLR
CUTANEOUS | Status: DC | PRN
  Administered 2014-10-10: 5000 [IU] via TOPICAL

## 2014-10-10 MED ORDER — NEOSTIGMINE METHYLSULFATE 10 MG/10ML IV SOLN
INTRAVENOUS | Status: DC | PRN
  Administered 2014-10-10: 3 mg via INTRAVENOUS

## 2014-10-10 MED ORDER — SUCCINYLCHOLINE CHLORIDE 20 MG/ML IJ SOLN
INTRAMUSCULAR | Status: DC | PRN
  Administered 2014-10-10: 100 mg via INTRAVENOUS

## 2014-10-10 MED ORDER — MIDAZOLAM HCL 2 MG/2ML IJ SOLN
INTRAMUSCULAR | Status: DC | PRN
  Administered 2014-10-10: 2 mg via INTRAVENOUS

## 2014-10-10 MED ORDER — HYDROMORPHONE HCL 1 MG/ML IJ SOLN: 0.5000 mg | INTRAMUSCULAR | Status: DC | PRN

## 2014-10-10 MED ORDER — ROCURONIUM BROMIDE 100 MG/10ML IV SOLN
INTRAVENOUS | Status: DC | PRN
  Administered 2014-10-10: 10 mg via INTRAVENOUS
  Administered 2014-10-10: 30 mg via INTRAVENOUS

## 2014-10-10 MED ORDER — HYDROCODONE-ACETAMINOPHEN 5-325 MG PO TABS: 1.0000 | ORAL_TABLET | ORAL | Status: AC | PRN

## 2014-10-10 MED ORDER — ONDANSETRON HCL 4 MG/2ML IJ SOLN
4.0000 mg | Freq: Once | INTRAMUSCULAR | Status: AC | PRN
  Administered 2014-10-10: 4 mg via INTRAVENOUS

## 2014-10-10 MED ORDER — DEXAMETHASONE SODIUM PHOSPHATE 4 MG/ML IJ SOLN
INTRAMUSCULAR | Status: DC | PRN
Start: 2014-10-10 — End: 2014-10-10
  Administered 2014-10-10: 5 mg via INTRAVENOUS

## 2014-10-10 MED ORDER — OXYCODONE-ACETAMINOPHEN 5-325 MG PO TABS
ORAL_TABLET | ORAL | Status: AC
  Administered 2014-10-10: 1 via ORAL
  Filled 2014-10-10: qty 1

## 2014-10-10 MED ORDER — SULFAMETHOXAZOLE-TRIMETHOPRIM 800-160 MG PO TABS: 1.0000 | ORAL_TABLET | Freq: Two times a day (BID) | ORAL | Status: AC

## 2014-10-10 MED ORDER — DEXTROSE 5 % IV SOLN
1.0000 g | Freq: Once | INTRAVENOUS | Status: AC
  Administered 2014-10-10: 1 g via INTRAVENOUS
  Filled 2014-10-10: qty 10

## 2014-10-10 MED ORDER — FENTANYL CITRATE (PF) 100 MCG/2ML IJ SOLN
INTRAMUSCULAR | Status: DC | PRN
  Administered 2014-10-10 (×4): 50 ug via INTRAVENOUS

## 2014-10-10 MED ORDER — ONDANSETRON 4 MG PO TBDP
4.0000 mg | ORAL_TABLET | Freq: Once | ORAL | Status: AC | PRN
  Administered 2014-10-10: 4 mg via ORAL

## 2014-10-10 MED ORDER — ONDANSETRON HCL 4 MG PO TABS: 4.0000 mg | ORAL_TABLET | Freq: Four times a day (QID) | ORAL | Status: DC | PRN

## 2014-10-10 MED ORDER — ONDANSETRON HCL 4 MG/2ML IJ SOLN
INTRAMUSCULAR | Status: AC
  Administered 2014-10-10: 4 mg via INTRAVENOUS
  Filled 2014-10-10: qty 8

## 2014-10-10 MED ORDER — OXYCODONE-ACETAMINOPHEN 5-325 MG PO TABS
1.0000 | ORAL_TABLET | Freq: Once | ORAL | Status: AC
Start: 2014-10-10 — End: 2014-10-10
  Administered 2014-10-10: 1 via ORAL

## 2014-10-10 MED ORDER — HYDROMORPHONE HCL 1 MG/ML IJ SOLN
INTRAMUSCULAR | Status: DC
Start: 2014-10-10 — End: 2014-10-10
  Filled 2014-10-10: qty 1

## 2014-10-10 MED ORDER — OXYCODONE-ACETAMINOPHEN 5-325 MG PO TABS
1.0000 | ORAL_TABLET | Freq: Once | ORAL | Status: AC
  Administered 2014-10-10: 1 via ORAL
  Filled 2014-10-10: qty 1

## 2014-10-10 MED ORDER — SILVER NITRATE-POT NITRATE 75-25 % EX MISC
CUTANEOUS | Status: DC | PRN
  Administered 2014-10-10: 1

## 2014-10-10 MED ORDER — ONDANSETRON HCL 4 MG/2ML IJ SOLN
INTRAMUSCULAR | Status: DC
Start: 2014-10-10 — End: 2014-10-10
  Filled 2014-10-10: qty 2

## 2014-10-10 MED ORDER — HYDROMORPHONE HCL 1 MG/ML IJ SOLN
1.0000 mg | Freq: Once | INTRAMUSCULAR | Status: AC
  Administered 2014-10-10: 1 mg via INTRAVENOUS
  Filled 2014-10-10: qty 1

## 2014-10-10 MED ORDER — IBUPROFEN 800 MG PO TABS: 800.0000 mg | ORAL_TABLET | Freq: Three times a day (TID) | ORAL | Status: AC | PRN

## 2014-10-10 MED ORDER — HYDROMORPHONE HCL 1 MG/ML IJ SOLN
INTRAMUSCULAR | Status: AC
  Filled 2014-10-10: qty 1

## 2014-10-10 MED ORDER — BUPIVACAINE HCL 0.5 % IJ SOLN
INTRAMUSCULAR | Status: DC | PRN
  Administered 2014-10-10: 9 mL

## 2014-10-10 MED ORDER — ONDANSETRON HCL 4 MG/2ML IJ SOLN: 4.0000 mg | Freq: Four times a day (QID) | INTRAMUSCULAR | Status: DC | PRN

## 2014-10-10 MED ORDER — THROMBIN 5000 UNITS EX SOLR
CUTANEOUS | Status: AC
Start: 2014-10-10 — End: 2014-10-10
  Filled 2014-10-10: qty 5000

## 2014-10-10 MED ORDER — LACTATED RINGERS IV SOLN
INTRAVENOUS | Status: DC
  Administered 2014-10-10: 10:00:00 via INTRAVENOUS

## 2014-10-10 MED ORDER — FENTANYL CITRATE (PF) 100 MCG/2ML IJ SOLN
INTRAMUSCULAR | Status: AC
  Filled 2014-10-10: qty 2

## 2014-10-10 MED ORDER — HYDROMORPHONE HCL 1 MG/ML IJ SOLN
1.0000 mg | INTRAMUSCULAR | Status: AC
  Administered 2014-10-10: 1 mg via INTRAVENOUS
  Filled 2014-10-10: qty 1

## 2014-10-10 MED ORDER — FENTANYL CITRATE (PF) 100 MCG/2ML IJ SOLN
25.0000 ug | INTRAMUSCULAR | Status: DC | PRN
  Administered 2014-10-10 (×4): 25 ug via INTRAVENOUS

## 2014-10-10 MED ORDER — GLYCOPYRROLATE 0.2 MG/ML IJ SOLN
INTRAMUSCULAR | Status: DC | PRN
  Administered 2014-10-10: 0.6 mg via INTRAVENOUS

## 2014-10-10 MED ORDER — HYDROMORPHONE HCL 1 MG/ML IJ SOLN
0.5000 mg | INTRAMUSCULAR | Status: AC | PRN
  Administered 2014-10-10 (×4): 0.5 mg via INTRAVENOUS

## 2014-10-10 MED ORDER — ONDANSETRON HCL 4 MG/2ML IJ SOLN
4.0000 mg | Freq: Once | INTRAMUSCULAR | Status: AC
  Administered 2014-10-10: 4 mg via INTRAVENOUS
  Filled 2014-10-10: qty 2

## 2014-10-10 MED ORDER — LIDOCAINE HCL (CARDIAC) 20 MG/ML IV SOLN
INTRAVENOUS | Status: DC | PRN
  Administered 2014-10-10: 100 mg via INTRAVENOUS

## 2014-10-10 MED ORDER — PROPOFOL 10 MG/ML IV BOLUS
INTRAVENOUS | Status: DC | PRN
  Administered 2014-10-10: 150 mg via INTRAVENOUS

## 2014-10-10 MED ORDER — OXYCODONE-ACETAMINOPHEN 5-325 MG PO TABS: 1.0000 | ORAL_TABLET | Freq: Four times a day (QID) | ORAL | Status: AC | PRN

## 2014-10-10 MED ORDER — ONDANSETRON 4 MG PO TBDP
ORAL_TABLET | ORAL | Status: AC
  Filled 2014-10-10: qty 1

## 2014-10-10 MED ORDER — ONDANSETRON HCL 4 MG/2ML IJ SOLN
INTRAMUSCULAR | Status: DC | PRN
  Administered 2014-10-10: 4 mg via INTRAVENOUS

## 2014-10-10 SURGICAL SUPPLY — 50 items
APPLICATOR SURGIFLO (MISCELLANEOUS) ×3 IMPLANT
BAG URO DRAIN 2000ML W/SPOUT (MISCELLANEOUS) ×3 IMPLANT
BLADE SURG 15 STRL LF DISP TIS (BLADE) ×1 IMPLANT
BLADE SURG 15 STRL SS (BLADE) ×2
CANISTER SUCT 1200ML W/VALVE (MISCELLANEOUS) ×3 IMPLANT
CATH FOLEY 2WAY  5CC 16FR (CATHETERS) ×2
CATH ROBINSON RED A/P 16FR (CATHETERS) ×3 IMPLANT
CATH URTH 16FR FL 2W BLN LF (CATHETERS) ×1 IMPLANT
CHLORAPREP W/TINT 26ML (MISCELLANEOUS) ×3 IMPLANT
CORD MONOPOLAR M/FML 12FT (MISCELLANEOUS) ×3 IMPLANT
DEFOGGER SCOPE WARMER CLEARIFY (MISCELLANEOUS) ×3 IMPLANT
DRESSING SURGICEL FIBRLLR 1X2 (HEMOSTASIS) IMPLANT
DRESSING TELFA 4X3 1S ST N-ADH (GAUZE/BANDAGES/DRESSINGS) ×3 IMPLANT
DRSG SURGICEL FIBRILLAR 1X2 (HEMOSTASIS)
DRSG TEGADERM 2-3/8X2-3/4 SM (GAUZE/BANDAGES/DRESSINGS) ×9 IMPLANT
ENDOPOUCH RETRIEVER 10 (MISCELLANEOUS) ×3 IMPLANT
GAUZE SPONGE NON-WVN 2X2 STRL (MISCELLANEOUS) ×2 IMPLANT
GLOVE BIO SURGEON STRL SZ7 (GLOVE) ×6 IMPLANT
GLOVE INDICATOR 7.5 STRL GRN (GLOVE) ×3 IMPLANT
GOWN STRL REUS W/ TWL LRG LVL3 (GOWN DISPOSABLE) ×1 IMPLANT
GOWN STRL REUS W/ TWL XL LVL3 (GOWN DISPOSABLE) ×1 IMPLANT
GOWN STRL REUS W/TWL LRG LVL3 (GOWN DISPOSABLE) ×2
GOWN STRL REUS W/TWL XL LVL3 (GOWN DISPOSABLE) ×2
GRASPER SUT TROCAR 14GX15 (MISCELLANEOUS) ×3 IMPLANT
IRRIGATION STRYKERFLOW (MISCELLANEOUS) IMPLANT
IRRIGATOR STRYKERFLOW (MISCELLANEOUS)
IV LACTATED RINGERS 1000ML (IV SOLUTION) ×3 IMPLANT
KIT RM TURNOVER CYSTO AR (KITS) ×3 IMPLANT
LABEL OR SOLS (LABEL) ×3 IMPLANT
LIGASURE BLUNT 5MM 37CM (INSTRUMENTS) ×3 IMPLANT
LIQUID BAND (GAUZE/BANDAGES/DRESSINGS) ×3 IMPLANT
NS IRRIG 500ML POUR BTL (IV SOLUTION) ×3 IMPLANT
PACK GYN LAPAROSCOPIC (MISCELLANEOUS) ×3 IMPLANT
PAD OB MATERNITY 4.3X12.25 (PERSONAL CARE ITEMS) ×3 IMPLANT
PAD PREP 24X41 OB/GYN DISP (PERSONAL CARE ITEMS) ×3 IMPLANT
POUCH ENDO CATCH 10MM SPEC (MISCELLANEOUS) IMPLANT
SCISSORS METZENBAUM CVD 33 (INSTRUMENTS) ×3 IMPLANT
SLEEVE ENDOPATH XCEL 5M (ENDOMECHANICALS) IMPLANT
SPOGE SURGIFLO 8M (HEMOSTASIS) ×2
SPONGE SURGIFLO 8M (HEMOSTASIS) ×1 IMPLANT
SPONGE VERSALON 2X2 STRL (MISCELLANEOUS) ×4
SUT VIC AB 4-0 PS2 18 (SUTURE) ×3 IMPLANT
SUT VICRYL 0 AB UR-6 (SUTURE) ×3 IMPLANT
SYR 50ML LL SCALE MARK (SYRINGE) ×3 IMPLANT
SYRINGE 10CC LL (SYRINGE) ×3 IMPLANT
TROCAR BLUNT TIP 12MM OMST12BT (TROCAR) IMPLANT
TROCAR XCEL NON-BLD 11X100MML (ENDOMECHANICALS) ×3 IMPLANT
TROCAR XCEL NON-BLD 5MMX100MML (ENDOMECHANICALS) ×3 IMPLANT
TROCAR XCEL UNIV SLVE 11M 100M (ENDOMECHANICALS) ×3 IMPLANT
TUBING INSUFFLATOR HI FLOW (MISCELLANEOUS) ×3 IMPLANT

## 2014-10-10 NOTE — ED Notes (Addendum)
Pt uprite on stretcher in exam room, appears uncomfortable, moaning; reports laparoscopic procedure on Thursday for left ovarian cyst but found had already ruptured; c/o persistent left lower abd pain with vag bleeding; +BS, abd soft/nondist/tender to left lower quad

## 2014-10-10 NOTE — H&P (Signed)
GYN H&P  See consult note  Cornelia Copa MD [facility]  Pager: [phone]

## 2014-10-10 NOTE — Anesthesia Preprocedure Evaluation (Signed)
Anesthesia Evaluation  Patient identified by MRN, date of birth, ID band Patient awake    Reviewed: Allergy & Precautions, NPO status , Patient's Chart, lab work & pertinent test results, reviewed documented beta blocker date and time   Airway Mallampati: II  TM Distance: >3 FB     Dental  (+) Chipped   Pulmonary former smoker,           Cardiovascular      Neuro/Psych    GI/Hepatic   Endo/Other    Renal/GU      Musculoskeletal   Abdominal   Peds  Hematology   Anesthesia Other Findings   Reproductive/Obstetrics                             Anesthesia Physical Anesthesia Plan  ASA: II  Anesthesia Plan: General   Post-op Pain Management:    Induction: Intravenous  Airway Management Planned: Oral ETT  Additional Equipment:   Intra-op Plan:   Post-operative Plan:   Informed Consent: I have reviewed the patients History and Physical, chart, labs and discussed the procedure including the risks, benefits and alternatives for the proposed anesthesia with the patient or authorized representative who has indicated his/her understanding and acceptance.     Plan Discussed with: CRNA  Anesthesia Plan Comments:         Anesthesia Quick Evaluation  

## 2014-10-10 NOTE — Anesthesia Procedure Notes (Signed)
Procedure Name: Intubation Date/Time: 10/10/2014 10:29 AM Performed by: Irving Burton Pre-anesthesia Checklist: Patient identified, Emergency Drugs available, Suction available and Patient being monitored Patient Re-evaluated:Patient Re-evaluated prior to inductionOxygen Delivery Method: Circle system utilized Preoxygenation: Pre-oxygenation with 100% oxygen Intubation Type: IV induction, Rapid sequence and Cricoid Pressure applied Laryngoscope Size: Mac and 3 Grade View: Grade II Tube type: Oral Tube size: 7.0 mm Number of attempts: 1 Airway Equipment and Method: Patient positioned with wedge pillow and Stylet Placement Confirmation: ETT inserted through vocal cords under direct vision,  positive ETCO2 and breath sounds checked- equal and bilateral Secured at: 21 cm Tube secured with: Tape Dental Injury: Teeth and Oropharynx as per pre-operative assessment

## 2014-10-10 NOTE — ED Notes (Signed)
Report given to OR.

## 2014-10-10 NOTE — ED Notes (Signed)
Pt c/o L pelvic pain. Pt had a procedure performed to remove and ovarian cyst x 5 days ago. Pt states the pain has not been controlled since the procedure. Pt last took percocet that was rx'd for her pain since Saturday.

## 2014-10-10 NOTE — Anesthesia Postprocedure Evaluation (Signed)
  Anesthesia Post-op Note  Patient: Frances Kelly  Procedure(s) Performed: Procedure(s): DIAGNOSTIC LAPAROSCOPY;  LEFT OOPHERECTOMY (Left)  Anesthesia type:General  Patient location: PACU  Post pain: Pain level controlled  Post assessment: Post-op Vital signs reviewed, Patient's Cardiovascular Status Stable, Respiratory Function Stable, Patent Airway and No signs of Nausea or vomiting  Post vital signs: Reviewed and stable  Last Vitals:  Filed Vitals:   10/10/14 1414  BP: 138/85  Pulse: 87  Temp:   Resp: 16    Level of consciousness: awake, alert  and patient cooperative  Complications: No apparent anesthesia complications

## 2014-10-10 NOTE — ED Notes (Signed)
Pt returned form [place], resting in bed quietly in no distress

## 2014-10-10 NOTE — ED Provider Notes (Signed)
[facility] Emergency Department Provider Note ____________________________________________  Time seen: Approximately 6:22 AM  I have reviewed the triage vital signs and the nursing notes.   HISTORY  Chief Complaint Post-op Problem  HPI Frances Kelly is a 29 y.o. female who is complaining of severe left lower quadrant abdominal pain. Patient states that she had a cyst in her left ovary that was causing pain. Patient's doctor decided to take her for an evaluation and patient was set up to have a laparoscopic surgery done to make sure that it wasn't only assessed and she didn't have any endometriosis since these episodes have become chronic. Patient now complaining that she is status post a surgery and for the first 3 days and felt better but then all of a sudden got much worse again this morning. Patient denies any nausea, vomiting, change in her bowels, or any other symptoms. Patient denies any cough or cold symptoms as well. She also denies any dysuria frequency or hematuria. Patient states her pain on scale of 0-10 is a 10 at this time. Nothing seems to make the pain worse or better and patient has been doubled over in pain since she arrived in the ER.   Past Medical History  Diagnosis Date  . Ovarian cyst     Patient Active Problem List   Diagnosis Date Noted  . Left ovarian cyst 10/10/2014    Past Surgical History  Procedure Laterality Date  . Induced abortion    . Laparoscopy Left 10/06/2014    Procedure: LAPAROSCOPY DIAGNOSTIC;  Surgeon: Adrian Bing, MD;  Location: [facility] ORS;  Service: Gynecology;  Laterality: Left;  . Laparoscopic oopherectomy Left 10/10/2014    Procedure: DIAGNOSTIC LAPAROSCOPY;  LEFT OOPHERECTOMY;  Surgeon: [place] Bing, MD;  Location: [facility] ORS;  Service: Gynecology;  Laterality: Left;    Current Outpatient Rx  Name  Route  Sig  Dispense  Refill  . oxyCODONE-acetaminophen (ROXICET) 5-325 MG per tablet   Oral   Take 1 tablet  by mouth every 6 (six) hours as needed for severe pain.   15 tablet   0   . docusate sodium (COLACE) 100 MG capsule   Oral   Take 1 capsule (100 mg total) by mouth 2 (two) times daily.   45 capsule   0   . HYDROcodone-acetaminophen (NORCO) 5-325 MG per tablet   Oral   Take 1 tablet by mouth every 4 (four) hours as needed for moderate pain.   24 tablet   0   . ibuprofen (ADVIL,MOTRIN) 800 MG tablet   Oral   Take 1 tablet (800 mg total) by mouth every 8 (eight) hours as needed.   30 tablet   0   . levonorgestrel-ethinyl estradiol (SEASONALE,INTROVALE,JOLESSA) 0.15-0.03 MG tablet   Oral   Take 1 tablet by mouth daily.         Marland Kitchen oxyCODONE-acetaminophen (ROXICET) 5-325 MG per tablet   Oral   Take 1 tablet by mouth every 8 (eight) hours as needed.   20 tablet   0   . oxyCODONE-acetaminophen (ROXICET) 5-325 MG per tablet   Oral   Take 1 tablet by mouth every 6 (six) hours as needed for severe pain.   15 tablet   0   . sulfamethoxazole-trimethoprim (BACTRIM DS,SEPTRA DS) 800-160 MG per tablet   Oral   Take 1 tablet by mouth 2 (two) times daily.   20 tablet   0     Allergies Review of patient's allergies indicates no  known allergies.  History reviewed. No pertinent family history.  Social History Social History  Substance Use Topics  . Smoking status: Former Smoker -- 0.25 packs/day for 10 years    Quit date: 03/06/2014  . Smokeless tobacco: Never Used  . Alcohol Use: 0.0 - 3.6 oz/week    0-2 Glasses of wine, 0-2 Cans of beer, 0-2 Shots of liquor per week     Comment: 2 a week    Review of Systems Constitutional: No fever/chills Eyes: No visual changes. ENT: No sore throat. Cardiovascular: Denies chest pain. Respiratory: Denies shortness of breath. Gastrointestinal pain in the left lower quadrant and upper abdomen. Patient also had an significant abdominal cramping. No nausea, no vomiting.  No diarrhea.  No constipation. Genitourinary: Negative for  dysuria. Musculoskeletal: Negative for back pain. Skin: Negative for rash. Neurological: Negative for headaches, focal weakness or numbness. 10-point ROS otherwise negative.  ____________________________________________   PHYSICAL EXAM:  VITAL SIGNS: ED Triage Vitals  Enc Vitals Group     BP 10/10/14 0414 162/100 mmHg     Pulse Rate 10/10/14 0414 95     Resp 10/10/14 0414 22     Temp 10/10/14 0414 97.8 F (36.6 C)     Temp Source 10/10/14 0414 Oral     SpO2 10/10/14 0414 100 %     Weight 10/10/14 0414 170 lb (77.111 kg)     Height 10/10/14 0414 5\' 7"  (1.702 m)     Head Cir --      Peak Flow --      Pain Score 10/10/14 0415 10     Pain Loc --      Pain Edu? --      Excl. in GC? --     Constitutional: Alert and oriented. Well appearing and in moderate distress secondary to her pain. Eyes: Conjunctivae are normal. PERRL. EOMI. Head: Atraumatic. Nose: No congestion/rhinnorhea. Mouth/Throat: Mucous membranes are moist.  Oropharynx non-erythematous. Neck: No stridor.   Cardiovascular: Normal rate, regular rhythm. Grossly normal heart sounds.  Good peripheral circulation. Respiratory: Normal respiratory effort.  No retractions. Lungs CTAB. Gastrointestinal: Soft and tender palpation in her left lower quadrant.. No distention. No abdominal bruits. No CVA tenderness. Patient with multiple old abdominal scars from where he previously had surgery. Musculoskeletal: No lower extremity tenderness nor edema.  No joint effusions. Neurologic:  Normal speech and language. No gross focal neurologic deficits are appreciated. No gait instability. Skin:  Skin is warm, dry and intact. No rash noted. Psychiatric: Mood and affect are normal. Speech and behavior are normal.  ____________________________________________   LABS (all labs ordered are listed, but only abnormal results are displayed)  Labs Reviewed  COMPREHENSIVE METABOLIC PANEL - Abnormal; Notable for the following:    Glucose,  Bld 107 (*)    ALT 11 (*)    Alkaline Phosphatase 29 (*)    Total Bilirubin <0.1 (*)    All other components within normal limits  URINALYSIS COMPLETEWITH MICROSCOPIC ([facility] ONLY) - Abnormal; Notable for the following:    Color, Urine RED (*)    APPearance CLOUDY (*)    Hgb urine dipstick 3+ (*)    Protein, ur 100 (*)    Leukocytes, UA 1+ (*)    Bacteria, UA FEW (*)    Squamous Epithelial / LPF 6-30 (*)    All other components within normal limits  CBC  POCT PREGNANCY, URINE  TYPE AND SCREEN  SURGICAL PATHOLOGY   ____________________________________________  EKG  None ____________________________________________  RADIOLOGY  pelvic ultrasound pending US Transvaginal Non-ob  10/10/2014   CLINICAL DATA:  Pelvic pain. Recent laparoscopic surgery for left ovarian lesion. History of endometriosis.  EXAM: TRANSABDOMINAL AND TRANSVAGINAL ULTRASOUND OF PELVIS  TECHNIQUE: Both transabdominal and transvaginal ultrasound examinations of the pelvis were performed. Transabdominal technique was performed for global imaging of the pelvis including uterus, ovaries, adnexal regions, and pelvic cul-de-sac. It was necessary to proceed with endovaginal exam following the transabdominal exam to visualize the uterus and ovaries.  COMPARISON:  08/30/2014.  FINDINGS: Uterus  Measurements: 7.8 x 5.0 x 5.7 cm. No fibroids or other mass visualized.  Endometrium  Thickness: 7.3 mm.  No focal abnormality visualized.  Right ovary  Measurements: 2.4 x 3.4 x 1.7 cm. Normal appearance/no adnexal mass.  Left ovary  Measurements: 2.8 x 2.7 x 1.6 cm. 3.9 x 4.3 x 4.2 cm complex left adnexal mass, slightly larger than prior exam. Again this could represent an endometrioma given patient's history of endometriosis. Previously identified 1.9 cm complex left ovarian cyst no longer identified.  Other findings  No free fluid.  IMPRESSION: 1. 3.9 x 4.3 x 4.2 cm complex left adnexal mass, slightly larger than prior exam. Again this  could represent endometrioma given the patient's history of endometriosis. Other etiologies of complexes cannot be excluded. 2. Previously identified 1.9 cm complex left ovarian cyst is no longer identified .   Electronically Signed   By: Maisie Fus  Register   On: 10/10/2014 08:25   US Pelvis Complete  10/10/2014   CLINICAL DATA:  Pelvic pain. Recent laparoscopic surgery for left ovarian lesion. History of endometriosis.  EXAM: TRANSABDOMINAL AND TRANSVAGINAL ULTRASOUND OF PELVIS  TECHNIQUE: Both transabdominal and transvaginal ultrasound examinations of the pelvis were performed. Transabdominal technique was performed for global imaging of the pelvis including uterus, ovaries, adnexal regions, and pelvic cul-de-sac. It was necessary to proceed with endovaginal exam following the transabdominal exam to visualize the uterus and ovaries.  COMPARISON:  08/30/2014.  FINDINGS: Uterus  Measurements: 7.8 x 5.0 x 5.7 cm. No fibroids or other mass visualized.  Endometrium  Thickness: 7.3 mm.  No focal abnormality visualized.  Right ovary  Measurements: 2.4 x 3.4 x 1.7 cm. Normal appearance/no adnexal mass.  Left ovary  Measurements: 2.8 x 2.7 x 1.6 cm. 3.9 x 4.3 x 4.2 cm complex left adnexal mass, slightly larger than prior exam. Again this could represent an endometrioma given patient's history of endometriosis. Previously identified 1.9 cm complex left ovarian cyst no longer identified.  Other findings  No free fluid.  IMPRESSION: 1. 3.9 x 4.3 x 4.2 cm complex left adnexal mass, slightly larger than prior exam. Again this could represent endometrioma given the patient's history of endometriosis. Other etiologies of complexes cannot be excluded. 2. Previously identified 1.9 cm complex left ovarian cyst is no longer identified .   Electronically Signed   By: Maisie Fus  Register   On: 10/10/2014 08:25    ____________________________________________   PROCEDURES  Procedure(s) performed: None  Critical Care performed:  No  ____________________________________________   INITIAL IMPRESSION / ASSESSMENT AND PLAN / ED COURSE  Pertinent labs & imaging results that were available during my care of the patient were reviewed by me and considered in my medical decision making (see chart for details). ----------------------------------------- 6:26 AM on 10/10/2014 -----------------------------------------  Patient's to get a pelvic ultrasound to evaluate her left lower quadrant pain. Patient was given some pain and nausea meds in the ER. ____________________________________________  ----------------------------------------- 6:59 AM on 10/10/2014 -----------------------------------------  Patient will  be given some IV Rocephin for her obvious urinary tract infection. Patient's pain level has gone down significantly with the pain medicine. Patient will need to be placed on Septra and hydrocodone and ibuprofen at home for her urinary tract infection. Patient will be signed out to Dr. Suan Halter this morning pending her ultrasound results but if that is normal patient will be sent home on those medications that I just listed. Patient also running is some IV Toradol Pardue discharge.  FINAL CLINICAL IMPRESSION(S) / ED DIAGNOSES  Final diagnoses:  Pain  Acute abdominal pain  Urinary tract infection, acute  Recent major surgery      Leona Carry, MD 10/14/14 2000

## 2014-10-10 NOTE — ED Provider Notes (Signed)
CLINICAL DATA: Pelvic pain. Recent laparoscopic surgery for left ovarian lesion. History of endometriosis.  EXAM: TRANSABDOMINAL AND TRANSVAGINAL ULTRASOUND OF PELVIS  TECHNIQUE: Both transabdominal and transvaginal ultrasound examinations of the pelvis were performed. Transabdominal technique was performed for global imaging of the pelvis including uterus, ovaries, adnexal regions, and pelvic cul-de-sac. It was necessary to proceed with endovaginal exam following the transabdominal exam to visualize the uterus and ovaries.  COMPARISON: 08/30/2014.  FINDINGS: Uterus  Measurements: 7.8 x 5.0 x 5.7 cm. No fibroids or other mass visualized.  Endometrium  Thickness: 7.3 mm. No focal abnormality visualized.  Right ovary  Measurements: 2.4 x 3.4 x 1.7 cm. Normal appearance/no adnexal mass.  Left ovary  Measurements: 2.8 x 2.7 x 1.6 cm. 3.9 x 4.3 x 4.2 cm complex left adnexal mass, slightly larger than prior exam. Again this could represent an endometrioma given patient's history of endometriosis. Previously identified 1.9 cm complex left ovarian cyst no longer identified.  Other findings  No free fluid.  IMPRESSION: 1. 3.9 x 4.3 x 4.2 cm complex left adnexal mass, slightly larger than prior exam. Again this could represent endometrioma given the patient's history of endometriosis. Other etiologies of complexes cannot be excluded. 2. Previously identified 1.9 cm complex left ovarian cyst is no longer identified .  ----------------------------------------- 8:37 AM on 10/10/2014 -----------------------------------------  I have called the on-call for [facility] obstetric[facility] to discuss ultrasound findings. Patient remainsstable. Labs reviewed, ultrasound, exam, etc with Dr. Jean Rosenthal. Also she is currently being treated for urinary tract infection. Also discussed with Dr. Vergie Living. After review of clinical data, OB recommends pain control, discharge, and  follow-up in the office this week.  Dr. Vergie Living on evaluate the patient in ER, and is decided to admit the patient  Sharyn Creamer, MD 10/10/14 [phone]

## 2014-10-10 NOTE — Op Note (Addendum)
Operative Note   10/10/2014  PRE-OP DIAGNOSIS: persistent left lower quadrant pain and left ovarian cyst   POST-OP DIAGNOSIS: persistent left lower quadrant pain   SURGEON: Surgeon(s) and Role:    * Dixon Bing, MD - Primary  ASSISTANT: none  PROCEDURE: Procedure(s): DIAGNOSTIC LAPAROSCOPY;  LEFT OOPHERECTOMY   ANESTHESIA: General and local  ESTIMATED BLOOD LOSS: 25mL  DRAINS: indwelling foley UOP   TOTAL IV FLUIDS: 1L crystalloid  VTE PROPHYLAXIS: SCDs to the bilateral lower extremities  ANTIBIOTICS: none indicated  SPECIMENS: left ovary to pathology  DISPOSITION: PACU - hemodynamically stable.  CONDITION: stable  COMPLICATIONS: none  FINDINGS: no intra-abdominal adhesions, with slight bowel to the left pelvic brim. Grossly normal uterus, tubes and ovaries. Normal ureter traced into the lower pelvis, on the left side. 25mL old blood in the posterior cul de sac. After the surgery, the left tube was inspected and noted to be within normal limits. Normal EGBUS with normal vaginal vault and slight oozing coming from the external; patient is on her menses currently  INDICATIONS: persistent LLQ pain and 4cm left ovarian cyst seen on repeat ER ultrasound today.   DESCRIPTION OF PROCEDURE: After informed consent was obtained, the patient was taken to the operating room where anesthesia was obtained without difficulty. The patient was positioned in the dorsal lithotomy position in [place] stirrups and her arms were carefully tucked at her sides and the usual precautions were taken.  She was prepped and draped in normal sterile fashion.  Time-out was performed and a Foley catheter was placed into the bladder. A standard [place] uterine manipulator was then placed in the uterus without incident. Gloves were then changed, and after injection of local anesthesia, the open technique was used to place an infraumbilical 12-mm baloon trocar under direct visualization. The laparoscope was  introduced and CO2 gas was infused for pneumoperitoneum to a pressure of 15 mm Hg and the area below inspected for injury.  The patient was placed in Trendelenburg and the bowel was displaced up into the upper abdomen, and the right and left lateral 5-mm ports were placed under direct visualization of the laparoscope, after injection of local anesthesia.    The left IP ligament was then serialized cauterized and cut with the ligasure deviced, after finding the left ureter and making a window, with the monopolar scissors in the left broad ligament.  Next the left meso-salpingx was then cauterized and burned with the Ligasure device, with care to avoid damage to the left tube. The ovary was then removed from the abdomen with the endo-catch bag.  There was slight oozing at the left broad ligament and peritoneum,after turning the pressure to .  Surgi-flo was then applied to the slight oozing and the area observed under repeat low pressure and excellent hemostasis noted.  The old blood in the abdomen was then removed and the right and left lateral ports removed under direct visualization.  Before the umbilical trocar was removed the CO2 gas was released.  The fascia there was closed with 0 vicryl suture in a figure of eight fashion, after removal of the old stitch   The skin incision at the umbilicus was closed with a subcuticular stitch of 4-0 monocryl.  The remaining skin incisions were closed with Indermil glue.  The patient tolerated the procedure well.  Sponge, lap and needle counts were correct x2.  The patient was taken to recovery room in excellent condition.  Cornelia Copa MD [facility]  Pager: [phone]

## 2014-10-10 NOTE — Transfer of Care (Signed)
Immediate Anesthesia Transfer of Care Note  Patient: Frances Kelly  Procedure(s) Performed: Procedure(s): DIAGNOSTIC LAPAROSCOPY;  LEFT OOPHERECTOMY (Left)  Patient Location: PACU  Anesthesia Type:General  Level of Consciousness: sedated  Airway & Oxygen Therapy: Patient Spontanous Breathing and Patient connected to face mask oxygen  Post-op Assessment: Report given to RN and Post -op Vital signs reviewed and stable  Post vital signs: Reviewed and stable  Last Vitals:  Filed Vitals:   10/10/14 1221  BP: 129/82  Pulse: 72  Temp:   Resp: 17    Complications: No apparent anesthesia complications

## 2014-10-10 NOTE — ED Notes (Signed)
Pt taken to the OR.

## 2014-10-10 NOTE — Consult Note (Addendum)
Obstetrics & Gynecology Consultation Note  Date of Consultation: 10/10/2014   Requesting Provider: [facility] ER  Primary OBGYN: Frances Kelly Primary Care Provider: Jerl Mina  Reason for Consultation: acute onset LLQ and left ovarian cyst  History of Present Illness: Frances Kelly is a 29 y.o. LMP: now, with the above CC. PMHx significant for nothing. Patient had 8/4 diagnostic laparoscopy for 4cm left ovarian cyst. Just diagnostic scope done b/c on surgery, left ovarian had defect and there was old blood in the belly, so presumed ruptured and normal ovaries bilaterally and no cyst. E/o endometriosis but no biopsies done.  Patient states that starting last night she had acute onset LLQ that's persisted. No n/v/fevers/chills/dysuria. +VB from period  U/s in the ER shows RV 7.8 x 5 x 5.7cm uterus, ES 7.3 and 4cm complex LO adnexal cyst but normal right ovary.  On my review, +AV flow bilaterally and cyst appears to takes up nearly entire ovary and 2cm of normal stroma seen  ROS: A 12-point review of systems was performed and negative, except as stated in the above HPI.  OBGYN History: As per HPI. OB History    No data available       Past Medical History: Past Medical History  Diagnosis Date  . Ovarian cyst     Past Surgical History: Past Surgical History  Procedure Laterality Date  . Induced abortion    . Laparoscopy Left 10/06/2014    Procedure: LAPAROSCOPY DIAGNOSTIC;  Surgeon: [place] Bing, MD;  Location: [facility] ORS;  Service: Gynecology;  Laterality: Left;    Family History:  History reviewed. No pertinent family history. Social History:  History   Social History  . Marital Status: Single    Spouse Name: N/A  . Number of Children: N/A  . Years of Education: N/A   Occupational History  . Not on file.   Social History Main Topics  . Smoking status: Former Smoker -- 0.25 packs/day for 10 years    Quit date: 03/06/2014  . Smokeless tobacco: Never Used  . Alcohol Use: 0.0 - 3.6  oz/week    0-2 Glasses of wine, 0-2 Cans of beer, 0-2 Shots of liquor per week     Comment: 2 a week  . Drug Use: No  . Sexual Activity: Yes    Birth Control/ Protection: Pill   Other Topics Concern  . Not on file   Social History Narrative    Allergy: No Known Allergies  Current Outpatient Medications:  (Not in a hospital admission)   Hospital Medications: Current Facility-Administered Medications  Medication Dose Route Frequency Provider Last Rate Last Dose  . HYDROmorphone (DILAUDID) injection 0.5 mg  0.5 mg Intravenous Q2H PRN [place] Bing, MD      . lactated ringers infusion   Intravenous Continuous [place] Bing, MD      . ondansetron (ZOFRAN) tablet 4 mg  4 mg Oral Q6H PRN [place] Bing, MD       Or  . ondansetron (ZOFRAN) injection 4 mg  4 mg Intravenous Q6H PRN [place] Bing, MD      . ondansetron (ZOFRAN-ODT) 4 MG disintegrating tablet            Current Outpatient Prescriptions  Medication Sig Dispense Refill  . oxyCODONE-acetaminophen (ROXICET) 5-325 MG per tablet Take 1 tablet by mouth every 6 (six) hours as needed for severe pain. 15 tablet 0  . docusate sodium (COLACE) 100 MG capsule Take 1 capsule (100 mg total) by mouth 2 (two) times daily. 45 capsule  0  . HYDROcodone-acetaminophen (NORCO) 5-325 MG per tablet Take 1 tablet by mouth every 4 (four) hours as needed for moderate pain. 24 tablet 0  . ibuprofen (ADVIL,MOTRIN) 800 MG tablet Take 1 tablet (800 mg total) by mouth every 8 (eight) hours as needed. 30 tablet 0  . levonorgestrel-ethinyl estradiol (SEASONALE,INTROVALE,JOLESSA) 0.15-0.03 MG tablet Take 1 tablet by mouth daily.    Marland Kitchen oxyCODONE-acetaminophen (ROXICET) 5-325 MG per tablet Take 1 tablet by mouth every 8 (eight) hours as needed. 20 tablet 0  . sulfamethoxazole-trimethoprim (BACTRIM DS,SEPTRA DS) 800-160 MG per tablet Take 1 tablet by mouth 2 (two) times daily. 20 tablet 0     Physical Exam:  Current Vital Signs 24h Vital Sign Ranges   T 97.8 F (36.6 C) Temp  Avg: 97.8 F (36.6 C)  Min: 97.8 F (36.6 C)  Max: 97.8 F (36.6 C)  BP 132/82 mmHg BP  Min: 132/82  Max: 162/100  HR 75 Pulse  Avg: 79.3  Min: 70  Max: 95  RR 20 Resp  Avg: 21  Min: 20  Max: 22  SaO2 100 % Not Delivered SpO2  Avg: 99.8 %  Min: 99 %  Max: 100 %       24 Hour I/O Current Shift I/O  Time Ins Outs       Patient Vitals for the past 24 hrs:  BP Temp Temp src Pulse Resp SpO2 Height Weight  10/10/14 0830 132/82 mmHg - - 75 - 100 % - -  10/10/14 0700 (!) 144/78 mmHg - - 70 - 99 % - -  10/10/14 0616 (!) 147/82 mmHg - - 77 20 100 % - -  10/10/14 0414 (!) 162/100 mmHg 97.8 F (36.6 C) Oral 95 (!) 22 100 % 5\' 7"  (1.702 m) 170 lb (77.111 kg)    Body mass index is 26.62 kg/(m^2). General appearance: Well nourished, well developed female. Moderate distress from pain.  Cardiovascular:Regular rate and rhythm.  No murmurs, rubs or gallops. Respiratory:  Clear to auscultation bilateral. Normal respiratory effort Abdomen: soft, nd, moderately ttp in the LLQ. Incisions c/d/i and no e/o hernias Neuro/Psych:  Normal mood and affect.  Skin:  Warm and dry.   Pelvic exam: deferred  Laboratory: Beta HCG: neg via UPT  Recent Labs Lab 10/10/14 0440  WBC 8.0  HGB 13.2  HCT 37.6  PLT 205    Recent Labs Lab 10/10/14 0440  NA 141  K 4.3  CL 106  CO2 27  BUN 12  CREATININE 0.89  CALCIUM 9.0  PROT 7.6  BILITOT <0.1*  ALKPHOS 29*  ALT 11*  AST 16  GLUCOSE 107*    Recent Labs Lab 10/05/14 0803  ABORH O POS    Imaging:  As above  Assessment: persistent LO cyst  Plan: *Given findings and on surgery, no e/o cyst (pictures from surgery reviewed with patient) I recommend removal of the LO, which I didn't do b/c ovary looked normal and her age. But given s/s and inability to identify cyst from stroma, I recommend removal, which she is amenable to after r/b/a d/w pt. Will proceed with diagnostic laparoscopy, left oophorectomy. OR is aware  and will be able to do now. Type and screen ordered. *FEN/GI: NPO. Last PO last night. *Dispo: likely home after surgery  Cornelia Copa MD [facility] Pager [phone]

## 2014-10-10 NOTE — ED Notes (Signed)
Patient transported to Ultrasound 

## 2014-10-10 NOTE — Discharge Instructions (Addendum)
[facility] Laparoscopic Surgery Discharge Instructions  Instructions Following Laparoscopic Surgery You have just undergone a major laparoscopic surgery.  The following list should answer your most common questions.  Although we will discuss your surgery and post-operative instructions with you prior to your discharge, this list will serve as a reminder if you fail to recall the details of what we discussed.  We will discuss your surgery once again in detail at your post-op visit in two to four weeks. If you havent already done so, please call to make your appointment as soon as possible.  How you will feel: Although you have just undergone a major surgery, your recovery will be significantly shorter since the surgery was performed through much smaller incisions than the traditional approach.  You should feel slightly better each day.  If you suddenly feel much worse than the prior day, please call the clinic.  Its important during the early part of your recovery that you maintain some activity.  Walking is encouraged.  You will quicken your recovery by continued activity.  Incision:  Your incisions will be closed with dissolvable stitches or surgical adhesive (glue).  There may be Band-aids and/or Steri-strips covering your incisions.  If there is no drainage from the incisions you may remove the Band-aids in one to two days.  You may notice some minor bruising at the incision sites.  This is common and will resolve within several days.  Please inform us if the redness at the edges of your incision appears to be spreading.  If the skin around your incision becomes warm to the touch, or if you notice a pus-like drainage, please call the office.  Sexual Activity Following a Hysterectomy: Do not have sexual intercourse or place tampons or douches in the vagina prior to your first office visit.  We will discuss when you may resume these activities at that visit.    Stairs/Driving/Activities: You may  climb stairs if necessary.  If youve had general anesthesia, do not drive a car the rest of the day today.  You may begin light housework when you feel up to it, but avoid heavy lifting (more than 15-20lbs) or pushing until cleared for these activities by your physician.  Hygiene:  Do not soak your incisions.  Showers are acceptable but you may not take a bath or swim in a pool.  Cleanse your incisions daily with soap and water.  Medications:  Please resume taking any medications that you were taking prior to the surgery.  If we have prescribed any new medications for you, please take them as directed.  Constipation:  It is fairly common to experience some difficulty in moving your bowels following major surgery.  Being active will help to reduce this likelihood. A diet rich in fiber and plenty of liquids is desirable.  If you do become constipated, a mild laxative such as Miralax, Milk of Magnesia, or Metamucil, or a stool softener such as Colace, is recommended.  General Instructions: If you develop a fever of 100.5 degrees or higher, please call the office number(s) below for physician on call.    We will discuss your surgery once again in detail at your post-op visit in two to four weeks. If you havent already done so, please call to make your appointment as soon as possible.  [place] [place]  [address]  [place], [place] [address] [place], [place] [address]  Phone: [phone] Phone: [phone]  Fax: [phone] Fax: [phone]  AMBULATORY SURGERY  DISCHARGE INSTRUCTIONS  1) The drugs that you were given will stay in your system until tomorrow so for the next 24 hours you should not: A) Drive an automobile B) Make any legal decisions C) Drink any alcoholic beverage  2) You may resume regular meals tomorrow.  Today it is better to start with liquids and gradually work up to solid foods. You may eat anything you prefer, but it is better to start with  liquids, then soup and crackers, and gradually work up to solid foods.  3) Please notify your doctor immediately if you have any unusual bleeding, trouble breathing, redness and pain at the surgery site, drainage, fever, or pain not relieved by medication.  Please contact your physician with any problems or Same Day Surgery at [phone], Monday through Friday 6 am to 4 pm, or  at [facility] number at [phone].

## 2014-10-10 NOTE — Discharge Summary (Addendum)
Gynecology Discharge Summary Date of Admission: 10/10/2014 Date of Discharge: 10/10/2014  The patient was admitted and underwent a repeat diagnostic laparoscopy but this time with left oophorectomy for persistent and worsening LLQ pain and left adnexal cyst (3-4cm) still seen on u/s; please refer to operative note for full details, but diagnostic laparoscopy still normal, with left ovary appearing normal, but given s/s, it was advised to patient (pre-operatively) that if it and everything else still looked normal, which it did, that I would still recommend left oophorectomy, given her persistent and worsening s/s.Marland Kitchen  She was meeting all post op goals and discharged to home from the PACU.        Medication List    TAKE these medications        docusate sodium 100 MG capsule  Commonly known as:  COLACE  Take 1 capsule (100 mg total) by mouth 2 (two) times daily.     HYDROcodone-acetaminophen 5-325 MG per tablet  Commonly known as:  NORCO  Take 1 tablet by mouth every 4 (four) hours as needed for moderate pain.     ibuprofen 800 MG tablet  Commonly known as:  ADVIL,MOTRIN  Take 1 tablet (800 mg total) by mouth every 8 (eight) hours as needed.     levonorgestrel-ethinyl estradiol 0.15-0.03 MG tablet  Commonly known as:  SEASONALE,INTROVALE,JOLESSA  Take 1 tablet by mouth daily.     oxyCODONE-acetaminophen 5-325 MG per tablet  Commonly known as:  ROXICET  Take 1 tablet by mouth every 8 (eight) hours as needed.     oxyCODONE-acetaminophen 5-325 MG per tablet  Commonly known as:  ROXICET  Take 1 tablet by mouth every 6 (six) hours as needed for severe pain.     oxyCODONE-acetaminophen 5-325 MG per tablet  Commonly known as:  ROXICET  Take 1 tablet by mouth every 6 (six) hours as needed for severe pain.     sulfamethoxazole-trimethoprim 800-160 MG per tablet  Commonly known as:  BACTRIM DS,SEPTRA DS  Take 1 tablet by mouth 2 (two) times daily.        1wk follow up already made with  Dr. Vergie Living Refilled percocet Rx with 5/325 #15  [place] Bing, Montez Hageman. MD [facility]

## 2014-10-11 LAB — SURGICAL PATHOLOGY

## 2014-10-19 ENCOUNTER — Other Ambulatory Visit: Payer: Self-pay | Admitting: Obstetrics and Gynecology

## 2014-10-19 DIAGNOSIS — R1032 Left lower quadrant pain: Secondary | ICD-10-CM

## 2014-10-21 ENCOUNTER — Ambulatory Visit
Admission: RE | Admit: 2014-10-21 | Discharge: 2014-10-21 | Disposition: A | Discharge status: Home / Self Care | Payer: BC Managed Care – PPO | Source: Ambulatory Visit | Attending: Obstetrics and Gynecology | Admitting: Obstetrics and Gynecology

## 2014-10-21 DIAGNOSIS — R1900 Intra-abdominal and pelvic swelling, mass and lump, unspecified site: Secondary | ICD-10-CM | POA: Insufficient documentation

## 2014-10-21 DIAGNOSIS — R1032 Left lower quadrant pain: Secondary | ICD-10-CM | POA: Diagnosis present

## 2014-10-21 MED ORDER — IOHEXOL 300 MG/ML  SOLN
100.0000 mL | Freq: Once | INTRAMUSCULAR | Status: AC | PRN
  Administered 2014-10-21: 100 mL via INTRAVENOUS

## 2014-10-25 ENCOUNTER — Emergency Department
Admission: EM | Admit: 2014-10-25 | Discharge: 2014-10-25 | Disposition: A | Discharge status: Other Acute Inpatient Hospital | Payer: BC Managed Care – PPO | Attending: Emergency Medicine | Admitting: Emergency Medicine

## 2014-10-25 ENCOUNTER — Emergency Department: Payer: BC Managed Care – PPO

## 2014-10-25 ENCOUNTER — Encounter: Payer: Self-pay | Admitting: Emergency Medicine

## 2014-10-25 DIAGNOSIS — Z3202 Encounter for pregnancy test, result negative: Secondary | ICD-10-CM | POA: Insufficient documentation

## 2014-10-25 DIAGNOSIS — R102 Pelvic and perineal pain: Secondary | ICD-10-CM | POA: Diagnosis not present

## 2014-10-25 DIAGNOSIS — Z87891 Personal history of nicotine dependence: Secondary | ICD-10-CM | POA: Diagnosis not present

## 2014-10-25 DIAGNOSIS — R1032 Left lower quadrant pain: Secondary | ICD-10-CM | POA: Diagnosis present

## 2014-10-25 LAB — CBC WITH DIFFERENTIAL/PLATELET
Basophils Absolute: 0.1 10*3/uL (ref 0–0.1)
Basophils Relative: 2 %
Eosinophils Absolute: 0.1 10*3/uL (ref 0–0.7)
Eosinophils Relative: 2 %
HCT: 37.2 % (ref 35.0–47.0)
HEMOGLOBIN: 12.4 g/dL (ref 12.0–16.0)
LYMPHS ABS: 1.4 10*3/uL (ref 1.0–3.6)
LYMPHS PCT: 16 %
MCH: 29 pg (ref 26.0–34.0)
MCHC: 33.3 g/dL (ref 32.0–36.0)
MCV: 87.3 fL (ref 80.0–100.0)
Monocytes Absolute: 0.3 10*3/uL (ref 0.2–0.9)
Monocytes Relative: 4 %
NEUTROS ABS: 6.5 10*3/uL (ref 1.4–6.5)
NEUTROS PCT: 76 %
Platelets: 219 10*3/uL (ref 150–440)
RBC: 4.26 MIL/uL (ref 3.80–5.20)
RDW: 12.4 % (ref 11.5–14.5)
WBC: 8.4 10*3/uL (ref 3.6–11.0)

## 2014-10-25 LAB — URINALYSIS COMPLETE WITH MICROSCOPIC (ARMC ONLY)
BACTERIA UA: NONE SEEN
Bilirubin Urine: NEGATIVE
GLUCOSE, UA: NEGATIVE mg/dL
NITRITE: NEGATIVE
Protein, ur: NEGATIVE mg/dL
SPECIFIC GRAVITY, URINE: 1.025 (ref 1.005–1.030)
pH: 6 (ref 5.0–8.0)

## 2014-10-25 LAB — PREGNANCY, URINE: Preg Test, Ur: NEGATIVE

## 2014-10-25 LAB — COMPREHENSIVE METABOLIC PANEL
ALK PHOS: 32 U/L — AB (ref 38–126)
ALT: 28 U/L (ref 14–54)
AST: 20 U/L (ref 15–41)
Albumin: 3.8 g/dL (ref 3.5–5.0)
Anion gap: 7 (ref 5–15)
BUN: 11 mg/dL (ref 6–20)
CALCIUM: 8.9 mg/dL (ref 8.9–10.3)
CO2: 25 mmol/L (ref 22–32)
CREATININE: 0.94 mg/dL (ref 0.44–1.00)
Chloride: 106 mmol/L (ref 101–111)
GFR calc Af Amer: >60 mL/min (ref >60)
Glucose, Bld: 111 mg/dL — ABNORMAL HIGH (ref 65–99)
Potassium: 3.7 mmol/L (ref 3.5–5.1)
Sodium: 138 mmol/L (ref 135–145)
Total Bilirubin: 0.2 mg/dL — ABNORMAL LOW (ref 0.3–1.2)
Total Protein: 7.2 g/dL (ref 6.5–8.1)

## 2014-10-25 MED ORDER — HYDROMORPHONE HCL 1 MG/ML IJ SOLN
1.0000 mg | Freq: Once | INTRAMUSCULAR | Status: AC
  Administered 2014-10-25: 1 mg via INTRAVENOUS
  Filled 2014-10-25: qty 1

## 2014-10-25 MED ORDER — KETOROLAC TROMETHAMINE 30 MG/ML IJ SOLN
30.0000 mg | Freq: Once | INTRAMUSCULAR | Status: AC
  Administered 2014-10-25: 30 mg via INTRAVENOUS
  Filled 2014-10-25: qty 1

## 2014-10-25 NOTE — ED Provider Notes (Addendum)
[facility] Emergency Department Provider Note     Time seen: ----------------------------------------- 9:45 AM on 10/25/2014 -----------------------------------------    I have reviewed the triage vital signs and the nursing notes.   HISTORY  Chief Complaint Abdominal Pain    HPI Frances Kelly is a 29 y.o. female who presents to ER for severe left lower quadrant pain. Patient states she recently had laparoscopic surgery and oophorectomy on the left side. Patient is complaining of severe sharp pain in the left side, is having vaginal bleeding which she has had off and on this month. She's been referred to [facility] for follow-up in that locally they are unclear what is causing her pain. She denies any fever.Her prescription pain medicine is not helping.   Past Medical History  Diagnosis Date  . Ovarian cyst     Patient Active Problem List   Diagnosis Date Noted  . Left ovarian cyst 10/10/2014    Past Surgical History  Procedure Laterality Date  . Induced abortion    . Laparoscopy Left 10/06/2014    Procedure: LAPAROSCOPY DIAGNOSTIC;  Surgeon: [place] Bing, MD;  Location: [facility] ORS;  Service: Gynecology;  Laterality: Left;  . Laparoscopic oopherectomy Left 10/10/2014    Procedure: DIAGNOSTIC LAPAROSCOPY;  LEFT OOPHERECTOMY;  Surgeon: [place] Bing, MD;  Location: [facility] ORS;  Service: Gynecology;  Laterality: Left;  . Oophorectomy      Allergies Review of patient's allergies indicates no known allergies.  Social History Social History  Substance Use Topics  . Smoking status: Former Smoker -- 0.25 packs/day for 10 years    Quit date: 03/06/2014  . Smokeless tobacco: Never Used  . Alcohol Use: 0.0 - 3.6 oz/week    0-2 Glasses of wine, 0-2 Cans of beer, 0-2 Shots of liquor per week     Comment: 2 a week    Review of Systems Constitutional: Negative for fever. Eyes: Negative for visual changes. ENT: Negative for sore throat. Cardiovascular:  Negative for chest pain. Respiratory: Negative for shortness of breath. Gastrointestinal: Positive for abdominal pain Genitourinary: Negative for dysuria. Musculoskeletal: Negative for back pain. Skin: Negative for rash. Neurological: Negative for headaches, focal weakness or numbness.  10-point ROS otherwise negative.  ____________________________________________   PHYSICAL EXAM:  VITAL SIGNS: ED Triage Vitals  Enc Vitals Group     BP 10/25/14 0940 150/93 mmHg     Pulse Rate 10/25/14 0937 99     Resp 10/25/14 0937 24     Temp --      Temp src --      SpO2 10/25/14 0937 100 %     Weight 10/25/14 0937 170 lb (77.111 kg)     Height 10/25/14 0937 5\' 7"  (1.702 m)     Head Cir --      Peak Flow --      Pain Score 10/25/14 0940 10     Pain Loc --      Pain Edu? --      Excl. in GC? --     Constitutional: Alert and oriented. Mild distress, tearful Eyes: Conjunctivae are normal. PERRL. Normal extraocular movements. ENT   Head: Normocephalic and atraumatic.   Nose: No congestion/rhinnorhea.   Mouth/Throat: Mucous membranes are moist.   Neck: No stridor. Cardiovascular: Normal rate, regular rhythm. Normal and symmetric distal pulses are present in all extremities. No murmurs, rubs, or gallops. Respiratory: Normal respiratory effort without tachypnea nor retractions. Breath sounds are clear and equal bilaterally. No wheezes/rales/rhonchi. Gastrointestinal: Left lower quadrant tenderness,  no rebound or guarding. Normal bowel sounds. Musculoskeletal: Nontender with normal range of motion in all extremities. No joint effusions.  No lower extremity tenderness nor edema. Neurologic:  Normal speech and language. No gross focal neurologic deficits are appreciated. Speech is normal. No gait instability. Skin:  Skin is warm, dry and intact. No rash noted. Psychiatric: Mood and affect are normal. Speech and behavior are normal. Patient exhibits appropriate insight and  judgment.  ____________________________________________  ED COURSE:  Pertinent labs & imaging results that were available during my care of the patient were reviewed by me and considered in my medical decision making (see chart for details). Patient with persistent left lower quadrant pain. We'll provide pain relief, ensure she is not pregnant. We'll discuss with OB/GYN ____________________________________________    LABS (pertinent positives/negatives)  Labs Reviewed  COMPREHENSIVE METABOLIC PANEL - Abnormal; Notable for the following:    Glucose, Bld 111 (*)    Alkaline Phosphatase 32 (*)    Total Bilirubin 0.2 (*)    All other components within normal limits  URINALYSIS COMPLETEWITH MICROSCOPIC ([facility] ONLY) - Abnormal; Notable for the following:    Color, Urine YELLOW (*)    APPearance CLEAR (*)    Ketones, ur TRACE (*)    Hgb urine dipstick 3+ (*)    Leukocytes, UA TRACE (*)    Squamous Epithelial / LPF 6-30 (*)    All other components within normal limits  CBC WITH DIFFERENTIAL/PLATELET  PREGNANCY, URINE    RADIOLOGY Images were viewed by me  Pelvic ultrasound IMPRESSION: Normal RIGHT ovary.  Complex mass in LEFT pelvis 4.8 x 4.5 x 4.9 cm containing a thick hypervascular rim and an avascular hypoechoic core.  Based on imaging characteristics of this ultrasound exam and the prior CT, it is uncertain whether this mass is arising within the uterus such as from a degenerated leiomyoma or within the LEFT adnexa or fallopian tube, such as from an endometrioma or other tumor.  Recommend MR imaging with and without contrast to both characterize the lesion and to identify whether this is intrauterine or adnexal in origin. ____________________________________________  FINAL ASSESSMENT AND PLAN  Pelvic pain  Plan: Patient with labs and imaging as dictated above. Case was discussed with Dr. Vergie Living who may transfer her to [facility]. Unclear etiology for her severe  persistent pain.   Emily Filbert, MD   Emily Filbert, MD 10/25/14 1231  Emily Filbert, MD 10/25/14 [phone]

## 2014-10-25 NOTE — ED Notes (Signed)
Patient transported to Ultrasound 

## 2014-10-25 NOTE — Consult Note (Addendum)
Obstetrics & Gynecology Consultation Note  Date of Consultation: 10/25/2014   Requesting Provider: [facility] ER  Primary OBGYN: Consuella Lose Primary Care Provider: Jerl Mina  Reason for Consultation: acute on chronic LLQ pain  History of Present Illness: Frances Kelly is a 29 y.o.  (Patient's last menstrual period was 10/06/2014 (exact date).), with the above CC. PMHx is significant for recent surgery.  Patient has a several month history of abdominal and LLQ pain with a known left adnexal cyst 3-4cm. This was being followed expectantly but the pain became to great so she was posted for surgery. At her initial surgery on 10/06/14 she had only a diagnostic laparoscopy because there was no cyst seen, a hole in the ovary and blood/free fluid in the belly, so it was felt that it had ruptured prior to surgery.  She felt better initially but then re-presented to the ER on 8/8 and u/s showed persistent left complex adnexal cyst (3-4cm). Her surgery still showed negative findings on laparoscopy and her left ovary was removed which showed hemorrhagic features. She felt better but then had persistent abdominal pain again so an outpatient CT scan was done on 8/19: FINDINGS: Lung bases are clear.  No focal liver lesions are identified. The gallbladder wall is not appreciably thickened. There is no biliary duct dilatation.  Spleen, pancreas, and adrenals appear normal.  Kidneys bilaterally show no mass or hydronephrosis on either side. There is no renal or ureteral calculus on either side.  In the pelvis, the urinary bladder is midline. There is a focal area of decreased attenuation in the left pelvis with peripheral enhancement medially measuring 3.8 x 3.3 cm which has attenuation values slightly higher than is expected for a simple cyst. This structure is difficult to separate from the uterus but probably arises in the left adnexal region. There is a small amount of nearby fluid. No other pelvic  mass is seen.  Appendix appears within normal limits.  There is no bowel obstruction. No free air or portal venous air. There is mild fold thickening in the region of the distal gastric antrum and pylorus. There is no surrounding mesenteric stranding. No contrast extravasation.  There is no adenopathy or abscess in the abdomen or pelvis. Aorta appears within normal limits. There are no blastic or lytic bone lesions.  IMPRESSION: Slightly complex mass in the left pelvis which is difficult to separate from the uterus but probably arises from the left adnexa. Question hemorrhagic cyst arising from an ovarian remnant. There is no air in this structure to suggest abscess, although infected fluid in this area cannot be excluded. There is a small amount of nearby fluid which may be of postoperative etiology or also could be due to recent leakage from cyst. Pelvic ultrasound may be helpful for further delineation of this left adnexal cystic appearing structure.  Appendix appears normal. No bowel obstruction. No abscess. No renal or ureteral calculus.  Given this and her past surgeries with negative findings and ?retroperitoneal involvement, it was felt that referral to Gyn Onc was needed and the patient preferred [facility]  She comes to the ER today with worsening abdominal pain that was refractory to the outpatient Percocet that she was using. Having minimal VB and no GI s/s. She has received 2mg  of dilaudid and 30mg  of toradol thus far. U/s today shows: Both transabdominal and transvaginal ultrasound examinations of the pelvis were performed. Transabdominal technique was performed for global imaging of the pelvis including uterus, ovaries, adnexal regions, and pelvic  cul-de-sac. It was necessary to proceed with endovaginal exam following the transabdominal exam to visualize the endometrium and RIGHT ovary.  COMPARISON: Pelvic sonography 10/10/2014, CT pelvis  10/21/2014  FINDINGS: Uterus  Measurements: 8.5 x 5.0 x 4.6 cm. Retroverted. No focal uterine mass. Nabothian cyst at cervix.  Endometrium  Thickness: 7 mm thick, normal. No endometrial fluid or focal abnormality.  Right ovary  Measurements: 3.0 x 2.1 x 1.7 cm. Normal morphology without mass. Internal blood flow present on color Doppler imaging.  Left ovary  Surgically absent.  Other findings  Complex mass in LEFT adnexa 4.8 x 4.5 x 4.9 cm, uncertain if arising from adnexa, fallopian tube or LEFT lateral aspect of uterus. Lesion has a thick irregular rim with a central area of fairly uniform hypo echogenicity. Color Doppler imaging demonstrates hypervascularity in the periphery but not within the central hypoechoic core. No shadowing calcifications. Small amount of nonspecific free pelvic fluid.  IMPRESSION: Normal RIGHT ovary.  Complex mass in LEFT pelvis 4.8 x 4.5 x 4.9 cm containing a thick hypervascular rim and an avascular hypoechoic core.  Based on imaging characteristics of this ultrasound exam and the prior CT, it is uncertain whether this mass is arising within the uterus such as from a degenerated leiomyoma or within the LEFT adnexa or fallopian tube, such as from an endometrioma or other tumor.  Recommend MR imaging with and without contrast to both characterize the lesion and to identify whether this is intrauterine or adnexal in origin.   ROS: A 12-point review of systems was performed and negative, except as stated in the above HPI.  OBGYN History: As per HPI.   Past Medical History: Past Medical History  Diagnosis Date  . Ovarian cyst     Past Surgical History: as above Family History:  History reviewed. No pertinent family history.  Social History:  Social History   Social History  . Marital Status: Single    Spouse Name: N/A  . Number of Children: N/A  . Years of Education: N/A   Occupational History  . Not on  file.   Social History Main Topics  . Smoking status: Former Smoker -- 0.25 packs/day for 10 years    Quit date: 03/06/2014  . Smokeless tobacco: Never Used  . Alcohol Use: 0.0 - 3.6 oz/week    0-2 Glasses of wine, 0-2 Cans of beer, 0-2 Shots of liquor per week     Comment: 2 a week  . Drug Use: No  . Sexual Activity: Yes    Birth Control/ Protection: Pill   Other Topics Concern  . Not on file   Social History Narrative    Allergy: No Known Allergies  Current Outpatient Medications: Seasonale, percocet 5/325 PRN  Physical Exam: Filed Vitals:   10/25/14 0937 10/25/14 0940 10/25/14 1054 10/25/14 1100  BP:  150/93 135/91 148/97  Pulse: 99  95 85  Resp: 24  20   Height:  (1.702 m)     Weight: 170 lb (77.111 kg)     SpO2: 100%  100% 100%     Current Vital Signs 24h Vital Sign Ranges  T   No Data Recorded  BP (!) 148/97 mmHg BP  Min: 135/91  Max: 150/93  HR 85 Pulse  Avg: 93  Min: 85  Max: 99  RR 20 Resp  Avg: 22  Min: 20  Max: 24  SaO2 100 % Not Delivered SpO2  Avg: 100 %  Min: 100 %  Max: 100 %  24 Hour I/O Current Shift I/O  Time Ins Outs       Patient Vitals for the past 24 hrs:  BP Pulse Resp SpO2 Height Weight  10/25/14 1100 (!) 148/97 mmHg 85 - 100 % - -  10/25/14 1054 (!) 135/91 mmHg 95 20 100 % - -  10/25/14 0940 (!) 150/93 mmHg - - - - -  10/25/14 0937 - 99 (!) 24 100 % 5\' 7"  (1.702 m) 170 lb (77.111 kg)    Body mass index is 26.62 kg/(m^2). General appearance: mild to moderately uncomfortable Abd: +BS, soft, moderately ttp but no peritoneal s/s RRR no mRGs CTAB  Pelvic exam:deferred Laboratory: Beta HCG, urine: neg  Recent Labs Lab 10/25/14 0957  WBC 8.4  HGB 12.4  HCT 37.2  PLT 219    Recent Labs Lab 10/25/14 0957  NA 138  K 3.7  CL 106  CO2 25  BUN 11  CREATININE 0.94  CALCIUM 8.9  PROT 7.2  BILITOT 0.2*  ALKPHOS 32*  ALT 28  AST 20  GLUCOSE 111*   Imaging:  As above  Assessment: Frances Kelly is a 29 y.o.  with left adnexal complex cyst and persistent LLQ pain  Plan: Options d/w pt. She has a 9/1 appointment already set up with Gyn Onc but has not be able to get adequate pain control with outpatient therapies. I told her that I'd recommend talking to Gyn Onc at [facility] and seeing about if they can admit her to expedite her work up and see if she needs additional surgery, which they would have the capabilities to perform. Patient is amenable to this. I will call [facility] about transfer  Cornelia Copa MD [facility] Pager [phone]   Addendum: spoke to Dr. Nelly Rout at [facility]. Offered to move up appointment from 9/1 to 8/26, but I was concerned about patient possibly failing outpatient management again and re-presenting to the ER, as the patient as with stable and adequate pain control yesterday with percocet but had worsening pain refractory to it later into the day/early today. Given this, they are amenable to transfer. CT and u/s images left in patient's chart. Frances Kelly is amenable to this.  Cornelia Copa MD [facility]  Pager: [phone]

## 2014-10-25 NOTE — ED Notes (Signed)
Reports having left ovary removed on 8/6.  States still having pain in LLQ

## 2014-10-26 LAB — POCT PREGNANCY, URINE: Preg Test, Ur: NEGATIVE

## 2014-11-16 ENCOUNTER — Emergency Department
Admission: EM | Admit: 2014-11-16 | Discharge: 2014-11-17 | Disposition: A | Discharge status: Home / Self Care | Payer: BC Managed Care – PPO | Attending: Emergency Medicine | Admitting: Emergency Medicine

## 2014-11-16 ENCOUNTER — Emergency Department: Payer: BC Managed Care – PPO

## 2014-11-16 ENCOUNTER — Encounter: Payer: Self-pay | Admitting: Emergency Medicine

## 2014-11-16 DIAGNOSIS — Z79810 Long term (current) use of selective estrogen receptor modulators (SERMs): Secondary | ICD-10-CM | POA: Diagnosis not present

## 2014-11-16 DIAGNOSIS — Z90722 Acquired absence of ovaries, bilateral: Secondary | ICD-10-CM | POA: Diagnosis not present

## 2014-11-16 DIAGNOSIS — R51 Headache: Secondary | ICD-10-CM | POA: Diagnosis present

## 2014-11-16 DIAGNOSIS — Z87891 Personal history of nicotine dependence: Secondary | ICD-10-CM | POA: Insufficient documentation

## 2014-11-16 DIAGNOSIS — Z79899 Other long term (current) drug therapy: Secondary | ICD-10-CM | POA: Diagnosis not present

## 2014-11-16 DIAGNOSIS — Z792 Long term (current) use of antibiotics: Secondary | ICD-10-CM | POA: Insufficient documentation

## 2014-11-16 DIAGNOSIS — G43009 Migraine without aura, not intractable, without status migrainosus: Secondary | ICD-10-CM | POA: Insufficient documentation

## 2014-11-16 DIAGNOSIS — G43001 Migraine without aura, not intractable, with status migrainosus: Secondary | ICD-10-CM

## 2014-11-16 LAB — BASIC METABOLIC PANEL
Anion gap: 8 (ref 5–15)
BUN: 11 mg/dL (ref 6–20)
CALCIUM: 9.1 mg/dL (ref 8.9–10.3)
CO2: 24 mmol/L (ref 22–32)
Chloride: 109 mmol/L (ref 101–111)
Creatinine, Ser: 0.73 mg/dL (ref 0.44–1.00)
GFR calc non Af Amer: >60 mL/min (ref >60)
GLUCOSE: 97 mg/dL (ref 65–99)
Potassium: 3.5 mmol/L (ref 3.5–5.1)
Sodium: 141 mmol/L (ref 135–145)

## 2014-11-16 LAB — CBC WITH DIFFERENTIAL/PLATELET
BASOS PCT: 2 %
Basophils Absolute: 0.2 10*3/uL — ABNORMAL HIGH (ref 0–0.1)
Eosinophils Absolute: 0.1 10*3/uL (ref 0–0.7)
Eosinophils Relative: 1 %
HCT: 35.3 % (ref 35.0–47.0)
Hemoglobin: 11.5 g/dL — ABNORMAL LOW (ref 12.0–16.0)
Lymphocytes Relative: 15 %
Lymphs Abs: 1.3 10*3/uL (ref 1.0–3.6)
MCH: 28.5 pg (ref 26.0–34.0)
MCHC: 32.6 g/dL (ref 32.0–36.0)
MCV: 87.4 fL (ref 80.0–100.0)
MONOS PCT: 5 %
Monocytes Absolute: 0.4 10*3/uL (ref 0.2–0.9)
NEUTROS ABS: 6.7 10*3/uL — AB (ref 1.4–6.5)
Neutrophils Relative %: 77 %
Platelets: 173 10*3/uL (ref 150–440)
RBC: 4.04 MIL/uL (ref 3.80–5.20)
RDW: 12.3 % (ref 11.5–14.5)
WBC: 8.7 10*3/uL (ref 3.6–11.0)

## 2014-11-16 MED ORDER — KETOROLAC TROMETHAMINE 30 MG/ML IJ SOLN
30.0000 mg | Freq: Once | INTRAMUSCULAR | Status: AC
  Administered 2014-11-16: 30 mg via INTRAVENOUS
  Filled 2014-11-16: qty 1

## 2014-11-16 MED ORDER — METOCLOPRAMIDE HCL 5 MG/ML IJ SOLN
10.0000 mg | Freq: Once | INTRAMUSCULAR | Status: AC
  Administered 2014-11-16: 10 mg via INTRAVENOUS
  Filled 2014-11-16: qty 2

## 2014-11-16 MED ORDER — SODIUM CHLORIDE 0.9 % IV BOLUS (SEPSIS)
1000.0000 mL | Freq: Once | INTRAVENOUS | Status: AC
  Administered 2014-11-16: 1000 mL via INTRAVENOUS

## 2014-11-16 MED ORDER — MAGNESIUM SULFATE 2 GM/50ML IV SOLN
2.0000 g | Freq: Once | INTRAVENOUS | Status: AC
  Administered 2014-11-16: 2 g via INTRAVENOUS
  Filled 2014-11-16: qty 50

## 2014-11-16 NOTE — ED Notes (Signed)
Pt arrived to the ED for complaints of the worst headache she has ever experienced. Pt states that she was watching a movie when she began to experience a headache which turned into the worse headache that the Pt has ever experienced. Pt reports that she became warm, nauseated and wanting to have a BM. Pt is AOx4 in moderate pain distress.

## 2014-11-16 NOTE — Discharge Instructions (Signed)
Your headache meets the criteria for a migraine. We treated with migraine medicines and you improved. We discussed the small chance that they could be a bleed and discussed the option of a lumbar puncture, but we agreed that this would not be appropriate at this time. Return to the emergency department if you have a return of the severe headache, if you have fever, any focal weakness, or other urgent concerns.  Migraine Headache A migraine headache is very bad, throbbing pain on one or both sides of your head. Talk to your doctor about what things may bring on (trigger) your migraine headaches. HOME CARE  Only take medicines as told by your doctor.  Lie down in a dark, quiet room when you have a migraine.  Keep a journal to find out if certain things bring on migraine headaches. For example, write down:  What you eat and drink.  How much sleep you get.  Any change to your diet or medicines.  Lessen how much alcohol you drink.  Quit smoking if you smoke.  Get enough sleep.  Lessen any stress in your life.  Keep lights dim if bright lights bother you or make your migraines worse. GET HELP RIGHT AWAY IF:   Your migraine becomes really bad.  You have a fever.  You have a stiff neck.  You have trouble seeing.  Your muscles are weak, or you lose muscle control.  You lose your balance or have trouble walking.  You feel like you will pass out (faint), or you pass out.  You have really bad symptoms that are different than your first symptoms. MAKE SURE YOU:   Understand these instructions.  Will watch your condition.  Will get help right away if you are not doing well or get worse. Document Released: 11/28/2007 Document Revised: 05/13/2011 Document Reviewed: 10/26/2012 [facility] Patient Information 2015 [place], [place]. This information is not intended to replace advice given to you by your health care provider. Make sure you discuss any questions you have with your health  care provider.

## 2014-11-16 NOTE — ED Provider Notes (Signed)
[facility] Emergency Department Provider Note  ____________________________________________  Time seen: 2145 I have reviewed the triage vital signs and the nursing notes.   HISTORY  Chief Complaint Headache     HPI Frances Kelly is a 29 y.o. female who reports having a severe headache.  The patient was watching a movie this evening when this headache began. It started slowly but progressed to be a severe headache. She has had some nausea. She denies any focal weakness or paresthesia. She is photophobic.  The patient reports that she does not usually get headaches. She does report she had a "vascular" headache once before. She is not sure what was meant by the label "vascular" but that is what she was told.  One month ago she had an oopherectomy. She had one ovary removed because of a cyst. She has endometriosis as well. Since having the ovary removed, she received a Lupron injection.    Past Medical History  Diagnosis Date  . Ovarian cyst     Patient Active Problem List   Diagnosis Date Noted  . Left ovarian cyst 10/10/2014    Past Surgical History  Procedure Laterality Date  . Induced abortion    . Laparoscopy Left 10/06/2014    Procedure: LAPAROSCOPY DIAGNOSTIC;  Surgeon: New  Bing, MD;  Location: [facility] ORS;  Service: Gynecology;  Laterality: Left;  . Laparoscopic oopherectomy Left 10/10/2014    Procedure: DIAGNOSTIC LAPAROSCOPY;  LEFT OOPHERECTOMY;  Surgeon: [place] Bing, MD;  Location: [facility] ORS;  Service: Gynecology;  Laterality: Left;  . Oophorectomy      Current Outpatient Rx  Name  Route  Sig  Dispense  Refill  . docusate sodium (COLACE) 100 MG capsule   Oral   Take 1 capsule (100 mg total) by mouth 2 (two) times daily.   45 capsule   0   . HYDROcodone-acetaminophen (NORCO) 5-325 MG per tablet   Oral   Take 1 tablet by mouth every 4 (four) hours as needed for moderate pain.   24 tablet   0   . ibuprofen (ADVIL,MOTRIN)  800 MG tablet   Oral   Take 1 tablet (800 mg total) by mouth every 8 (eight) hours as needed.   30 tablet   0   . levonorgestrel-ethinyl estradiol (SEASONALE,INTROVALE,JOLESSA) 0.15-0.03 MG tablet   Oral   Take 1 tablet by mouth daily.         Marland Kitchen oxyCODONE-acetaminophen (ROXICET) 5-325 MG per tablet   Oral   Take 1 tablet by mouth every 8 (eight) hours as needed.   20 tablet   0   . oxyCODONE-acetaminophen (ROXICET) 5-325 MG per tablet   Oral   Take 1 tablet by mouth every 6 (six) hours as needed for severe pain.   15 tablet   0   . oxyCODONE-acetaminophen (ROXICET) 5-325 MG per tablet   Oral   Take 1 tablet by mouth every 6 (six) hours as needed for severe pain. Patient taking differently: Take 1 tablet by mouth every 6 (six) hours as needed for severe pain. PT states she took two this morning.   15 tablet   0   . sulfamethoxazole-trimethoprim (BACTRIM DS,SEPTRA DS) 800-160 MG per tablet   Oral   Take 1 tablet by mouth 2 (two) times daily.   20 tablet   0     Allergies Review of patient's allergies indicates no known allergies.  History reviewed. No pertinent family history.  Social History Social History  Substance Use Topics  .  Smoking status: Former Smoker -- 0.25 packs/day for 10 years    Quit date: 03/06/2014  . Smokeless tobacco: Never Used  . Alcohol Use: 0.0 - 3.6 oz/week    0-2 Glasses of wine, 0-2 Cans of beer, 0-2 Shots of liquor per week     Comment: 2 a week    Review of Systems  Constitutional: Negative for fever. ENT: Negative for sore throat. Cardiovascular: Negative for chest pain. Respiratory: Negative for shortness of breath. Gastrointestinal: Negative for abdominal pain, vomiting and diarrhea. Genitourinary: recent oopherectomy. See history of present illness Musculoskeletal: No myalgias or injuries. Skin: Negative for rash. Neurological:positive for headache. Negative for focal neuro deficit.   10-point ROS otherwise  negative.  ____________________________________________   PHYSICAL EXAM:  VITAL SIGNS: ED Triage Vitals  Enc Vitals Group     BP 11/16/14 2125 134/91 mmHg     Pulse Rate 11/16/14 2125 85     Resp 11/16/14 2125 20     Temp 11/16/14 2125 98.2 F (36.8 C)     Temp src --      SpO2 11/16/14 2122 97 %     Weight 11/16/14 2125 170 lb (77.111 kg)     Height 11/16/14 2125 5\' 7"  (1.702 m)     Head Cir --      Peak Flow --      Pain Score 11/16/14 2126 10     Pain Loc --      Pain Edu? --      Excl. in GC? --     Constitutional: Alert and oriented. Appears uncomfortable. ENT   Head: Normocephalic and atraumatic.   Nose: No congestion/rhinnorhea.   Mouth/Throat: Mucous membranes are moist. Cardiovascular: Normal rate, regular rhythm, no murmur noted Respiratory:  Normal respiratory effort, no tachypnea.    Breath sounds are clear and equal bilaterally.  Gastrointestinal: Soft and nontender. No distention.  Musculoskeletal: No deformity noted. Nontender with normal range of motion in all extremities.  No noted edema. Neurologic:  Normal speech and language. Equal grip strength, 5 over 5 strength in all 4 extremities, ambulatory, negative pronator drift, negative Romberg, good finger to nose function, cranial nerves II through XII are intact. No gross focal neurologic deficits are appreciated.  Skin:  Skin is warm, dry. No rash noted. Psychiatric: Mood and affect are normal. Speech and behavior are normal.  ____________________________________________    Labs:  Labs Reviewed  CBC WITH DIFFERENTIAL/PLATELET - Abnormal; Notable for the following:    Hemoglobin 11.5 (*)    Neutro Abs 6.7 (*)    Basophils Absolute 0.2 (*)    All other components within normal limits  BASIC METABOLIC PANEL    ___________________________________________  RADIOLOGY  CT head: No acute changes, no hemorrhage  ____________________________________________   INITIAL IMPRESSION /  ASSESSMENT AND PLAN / ED COURSE  Pertinent labs & imaging results that were available during my care of the patient were reviewed by me and considered in my medical decision making (see chart for details).  Pleasant but uncomfortable-appearing 29 year old female. She is photophobic and requests id. The lights in the room. She has no noted neurologic deficit. Her headache may be related to hormonal changes with her recent oophrectomy. We will treat her with Reglan and magnesium, as we usually do for migraines, and assess for improvement.  ----------------------------------------- 11:32 PM on 11/16/2014 -----------------------------------------  Reassessment finds the patient significant more comfortable. She is in no acute distress now.  I have discussed the small potential for an undetected  intracranial hemorrhage or sentinel bleed with the patient and discussed the option of doing a lumbar puncture. She declines this. She is pleasant and appropriate with intact thought process and I think this is a reasonable decision. Her fianc was in the room and agrees with this as well.  Much more likely, this was a migraine headache, throbbing, nausea, that may have been induced by hormonal changes with her recent GYN treatment.   ____________________________________________   FINAL CLINICAL IMPRESSION(S) / ED DIAGNOSES  Final diagnoses:  Migraine without aura and with status migrainosus, not intractable      Darien Ramus, MD 11/16/14 2344

## 2015-10-13 IMAGING — US US PELVIS COMPLETE
1 series · 13 of 25 positions shown · non-contrast
Comparison: 08/30/2014.

CLINICAL DATA: Pelvic pain. Recent laparoscopic surgery for left
ovarian lesion. History of endometriosis.

EXAM:
TRANSABDOMINAL AND TRANSVAGINAL ULTRASOUND OF PELVIS
TECHNIQUE: Both transabdominal and transvaginal ultrasound examinations of the
pelvis were performed. Transabdominal technique was performed for
global imaging of the pelvis including uterus, ovaries, adnexal
regions, and pelvic cul-de-sac. It was necessary to proceed with
endovaginal exam following the transabdominal exam to visualize the
uterus and ovaries..

[Series 1: us pelvis complete · 0.22mm/px · 100 acquisitions, 13 frames shown]
[im 1/100]
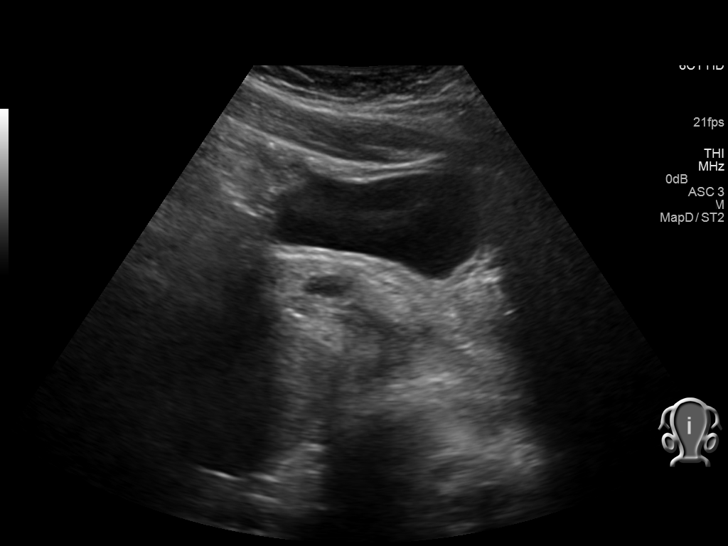
[im 9/100]
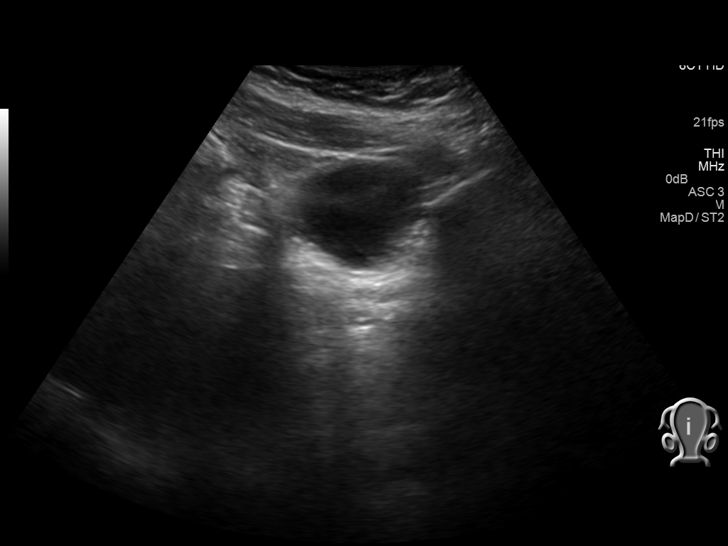
[im 17/100]
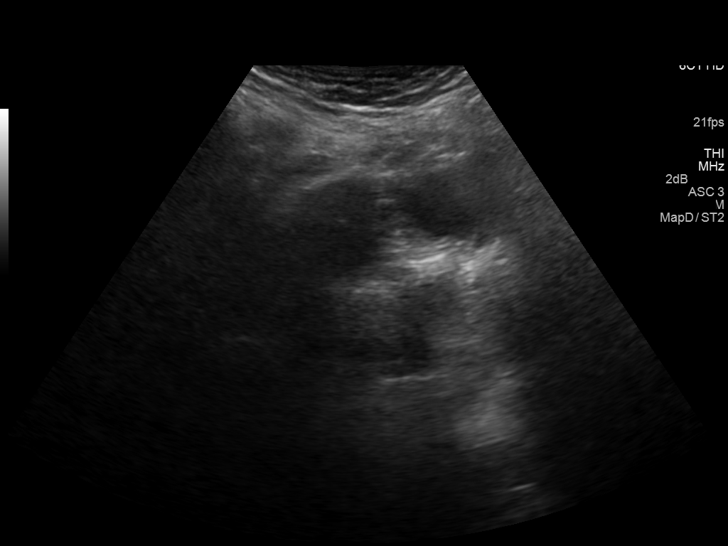
[im 25/100]
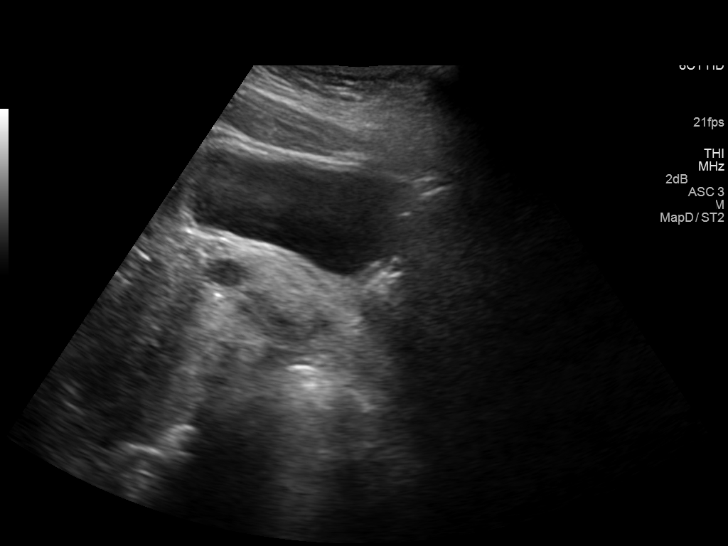
[im 34/100]
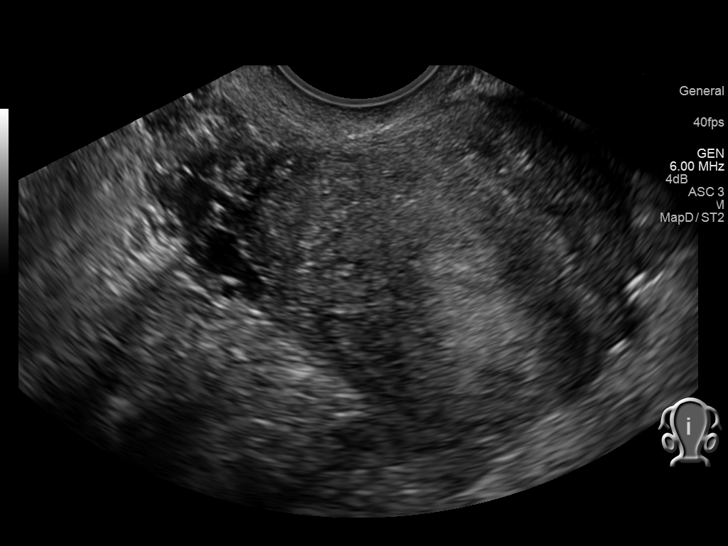
[im 42/100]
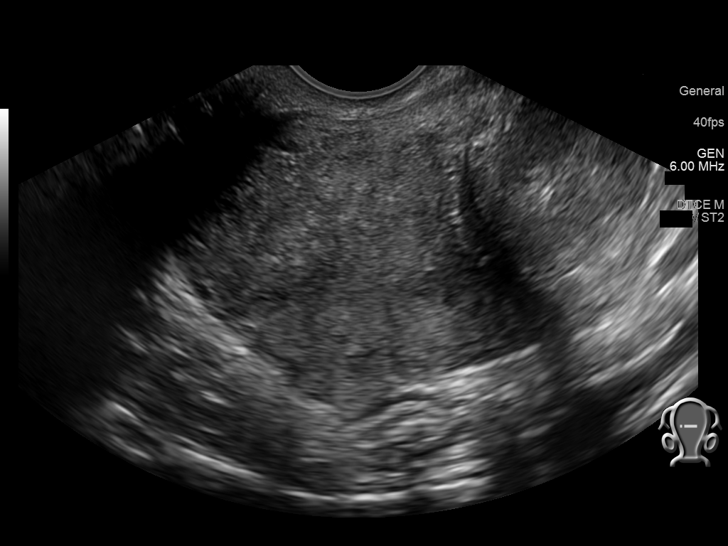
[im 50/100]
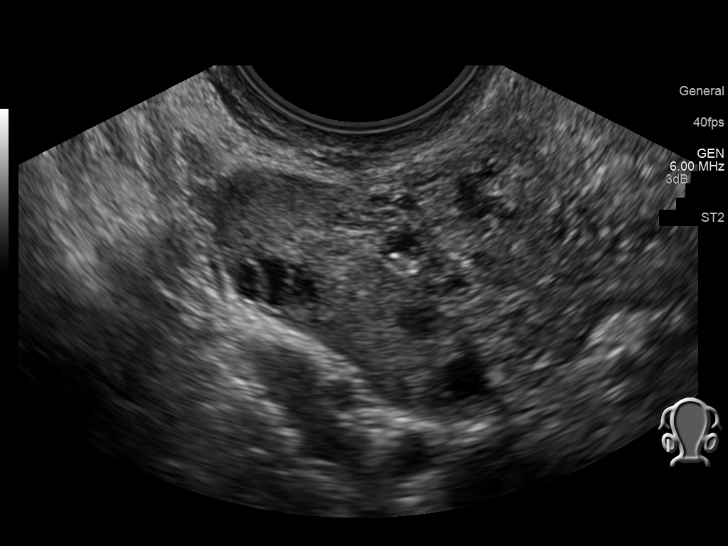
[im 58/100]
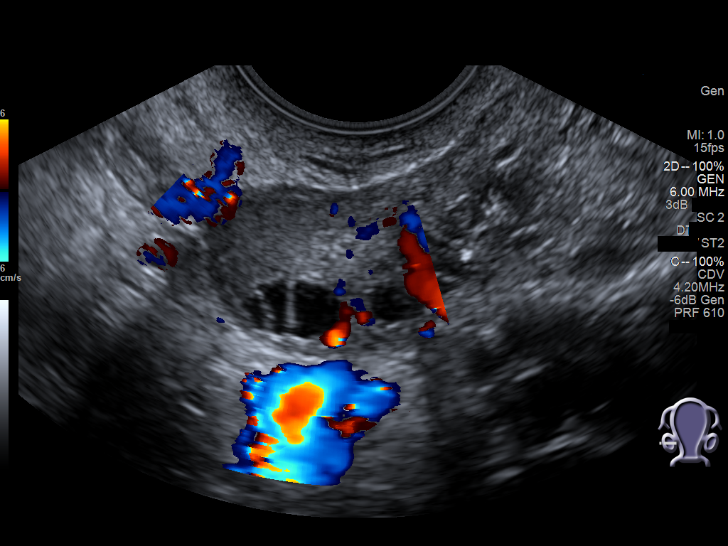
[im 67/100]
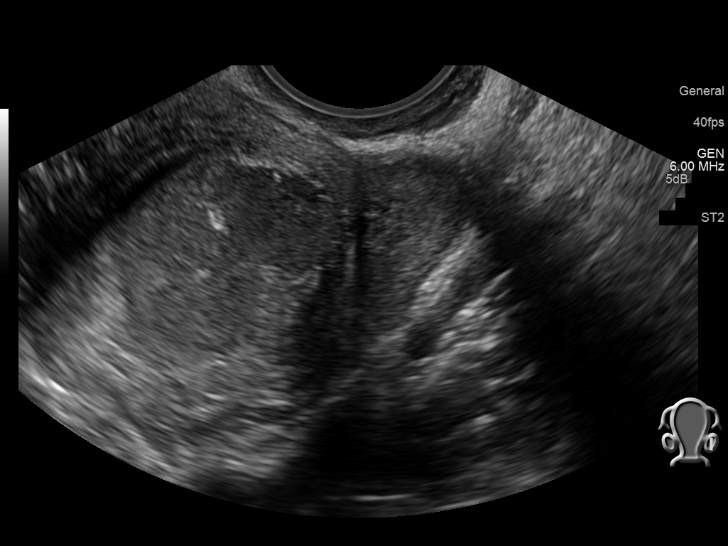
[im 75/100]
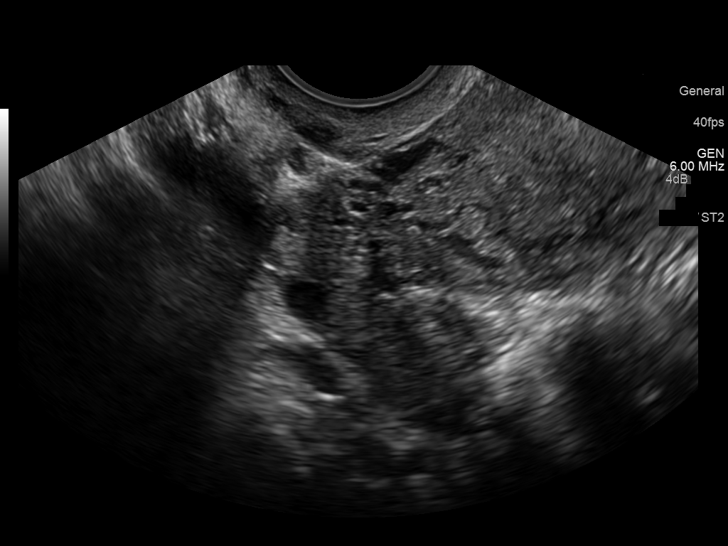
[im 83/100]
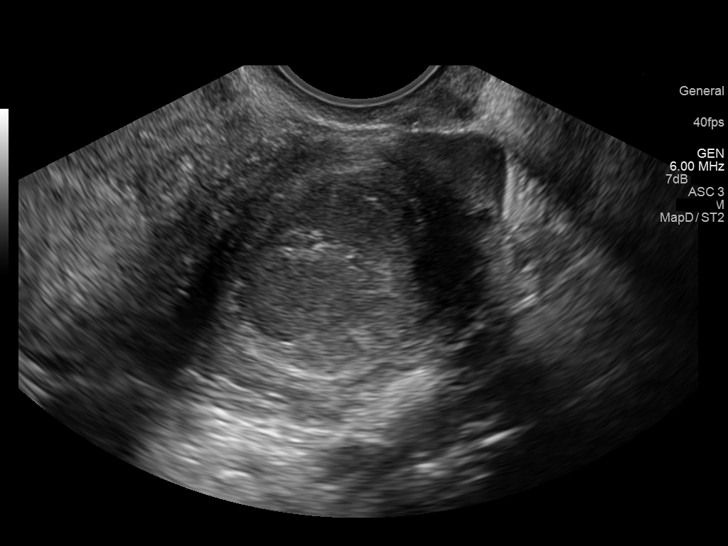
[im 91/100]
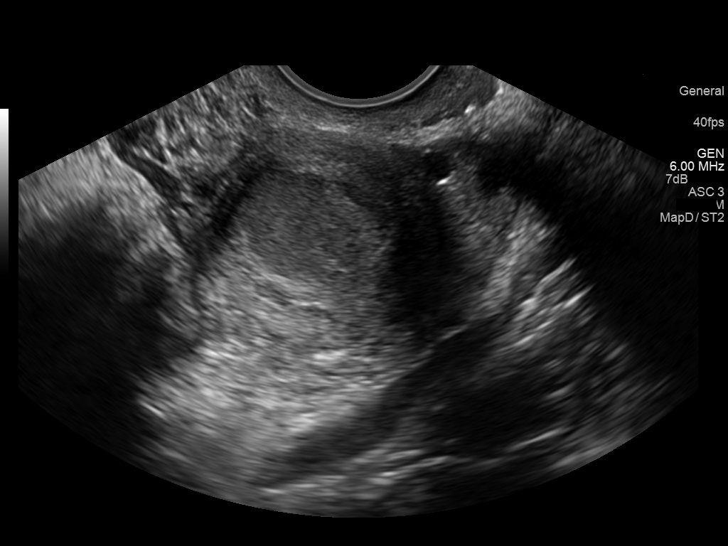
[im 100/100]
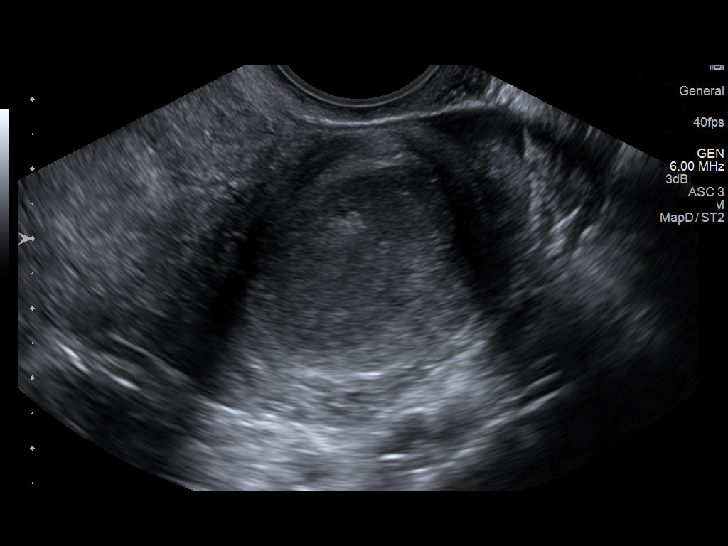

[13 of 25 positions shown; findings below may reference images not displayed]

FINDINGS: Uterus

Measurements: 7.8 x 5.0 x 5.7 cm. No fibroids or other mass
visualized.

Endometrium

Thickness: 7.3 mm.  No focal abnormality visualized.

Right ovary

Measurements: 2.4 x 3.4 x 1.7 cm. Normal appearance/no adnexal mass.

Left ovary

Measurements: 2.8 x 2.7 x 1.6 cm. 3.9 x 4.3 x 4.2 cm complex left
adnexal mass, slightly larger than prior exam. Again this could
represent an endometrioma given patient's history of endometriosis.
Previously identified 1.9 cm complex left ovarian cyst no longer
identified.

Other findings

No free fluid.
IMPRESSION: 1. 3.9 x 4.3 x 4.2 cm complex left adnexal mass, slightly larger
than prior exam. Again this could represent endometrioma given the
patient's history of endometriosis. Other etiologies of complexes
cannot be excluded.
2. Previously identified 1.9 cm complex left ovarian cyst is no
longer identified .

## 2017-07-03 IMAGING — CT CT HEAD W/O CM
1 series · 16 of 30 positions shown, 20 images · non-contrast
Comparison: 10/04/2009

CLINICAL DATA: Acute onset of headache. Worst headache of her life.
Code stroke.

EXAM:
CT HEAD WITHOUT CONTRAST
TECHNIQUE: Contiguous axial images were obtained from the base of the skull
through the vertex without intravenous contrast.

[Series 2: head wo · axial · 0.39mm/px · z∈[-34,+106]mm · 16 of 32 slices shown, 20 images]
[im 2/32  brain]
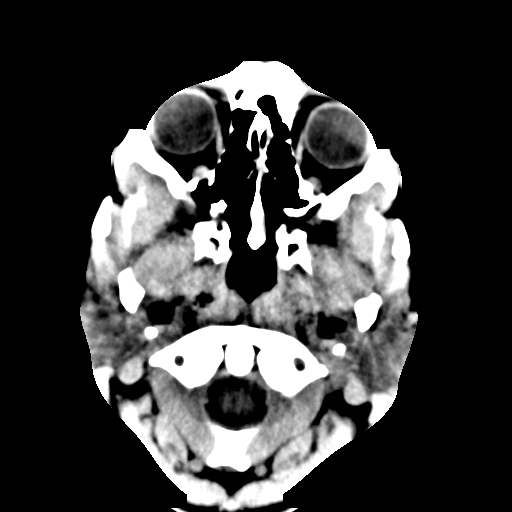
[im 2/32  bone]
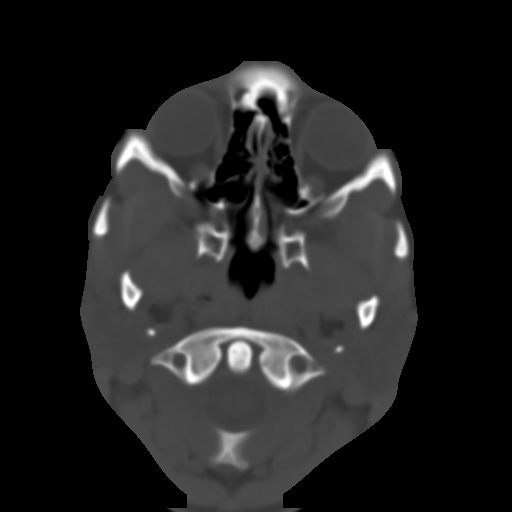
[im 4/32  brain]
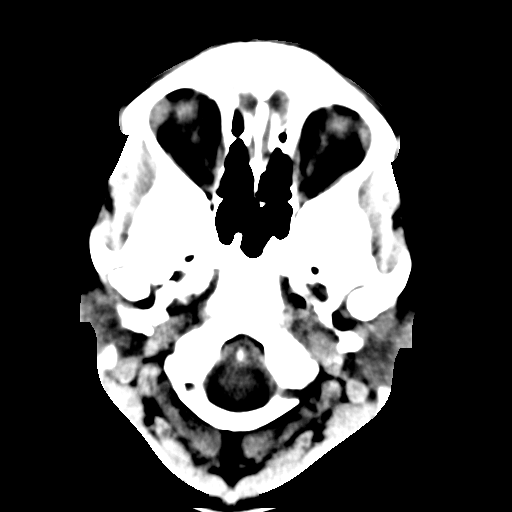
[im 6/32  brain]
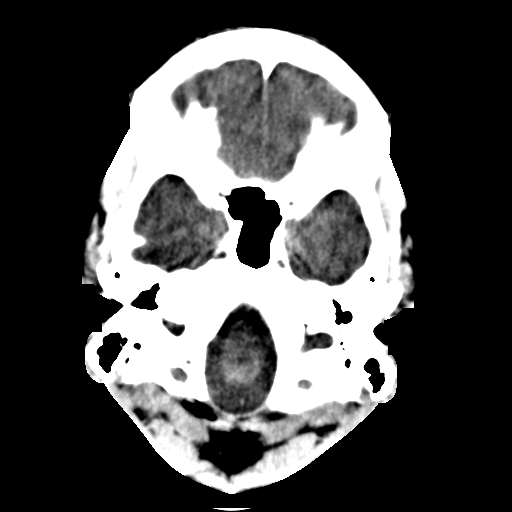
[im 8/32  brain]
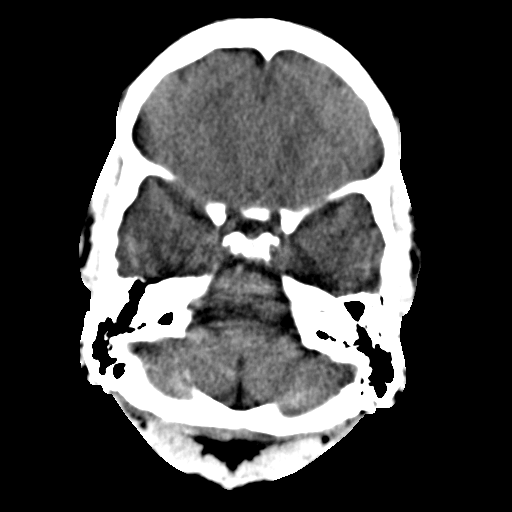
[im 9/32  brain]
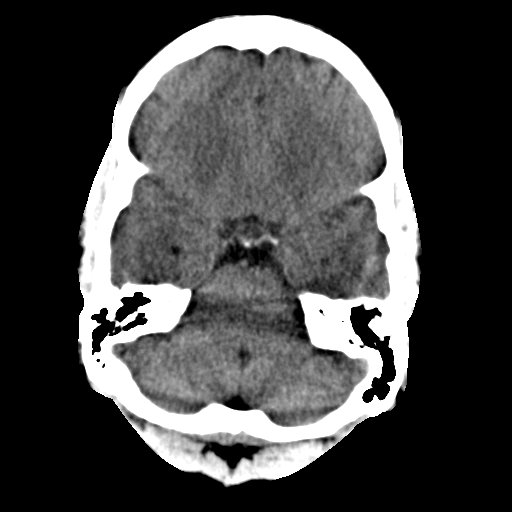
[im 9/32  bone]
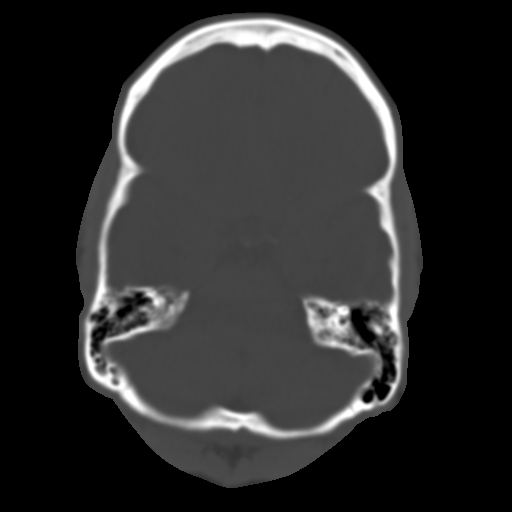
[im 11/32  brain]
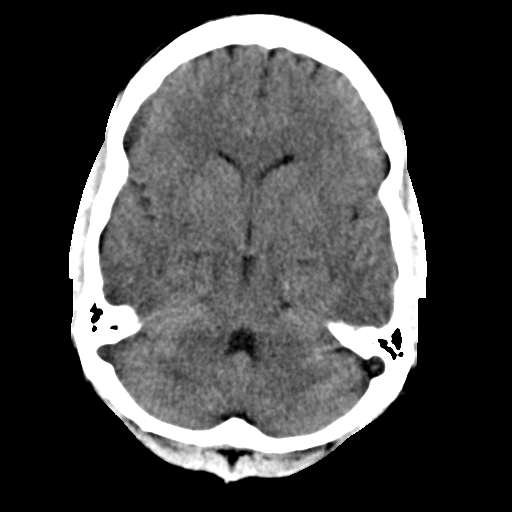
[im 13/32  brain]
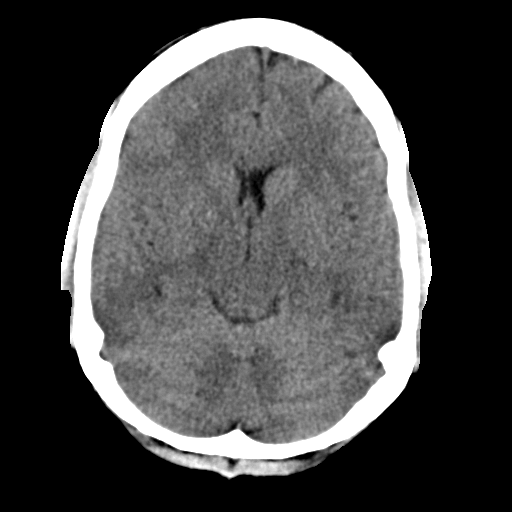
[im 15/32  brain]
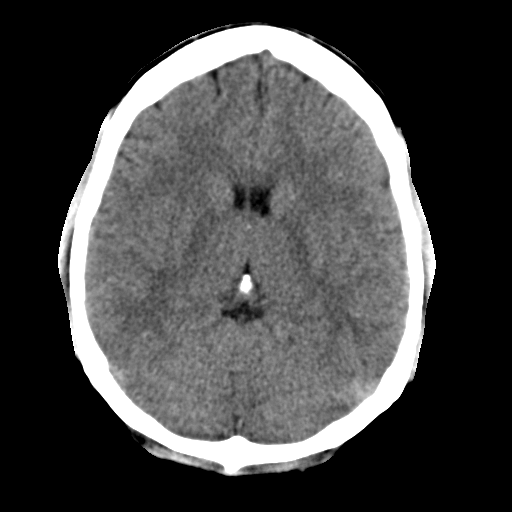
[im 17/32  brain]
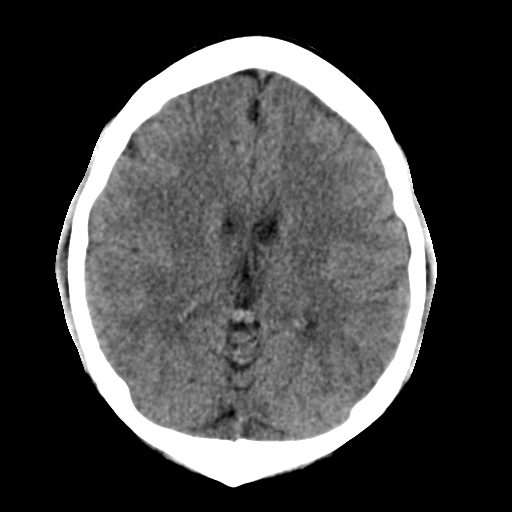
[im 17/32  bone]
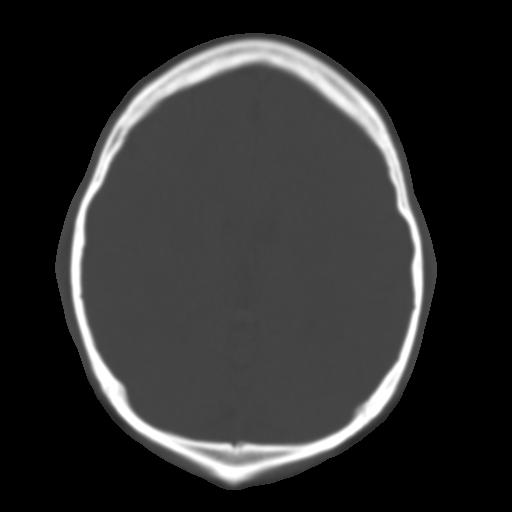
[im 19/32  brain]
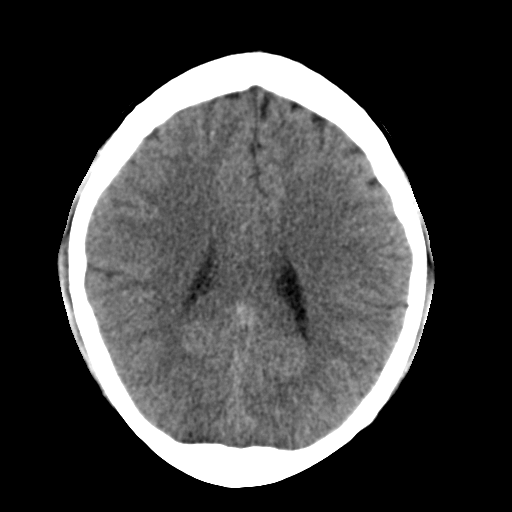
[im 21/32  brain]
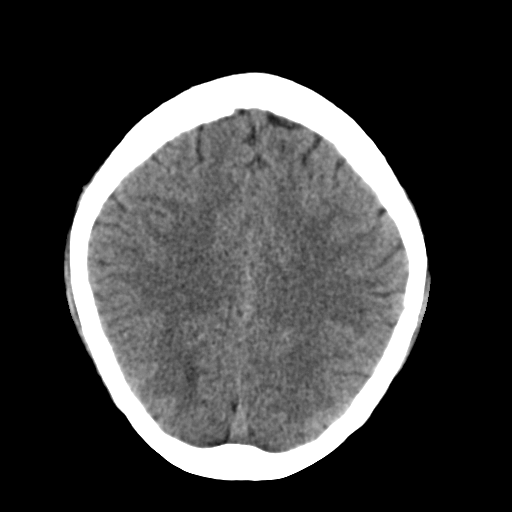
[im 23/32  brain]
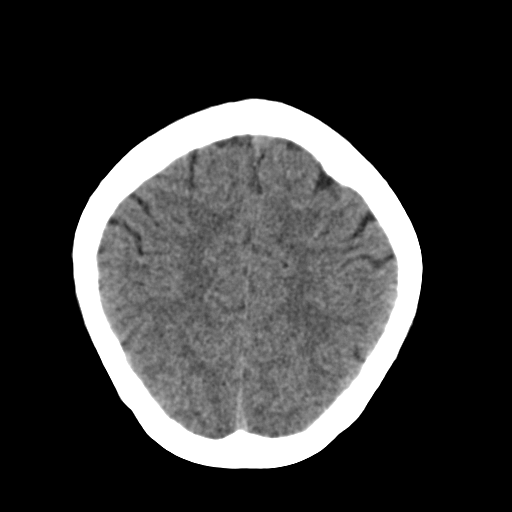
[im 24/32  brain]
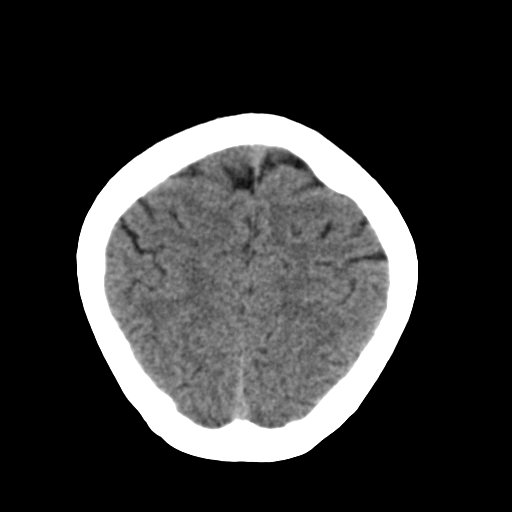
[im 24/32  bone]
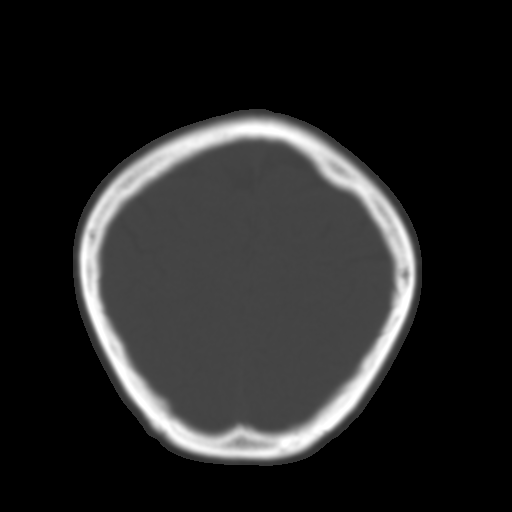
[im 26/32  brain]
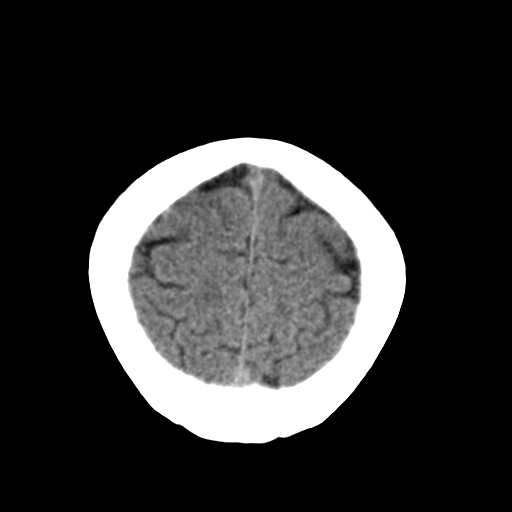
[im 28/32  brain]
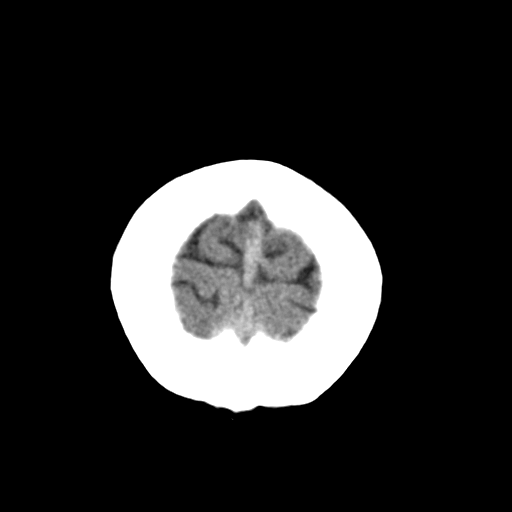
[im 30/32  brain]
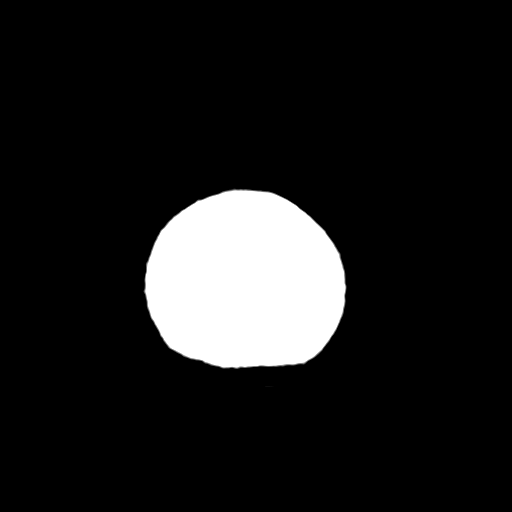

[16 of 30 positions shown; findings below may reference images not displayed]

FINDINGS: No intracranial hemorrhage, mass effect, or midline shift. No
hydrocephalus. The basilar cisterns are patent. No evidence of
territorial infarct. No intracranial fluid collection. Calvarium is
intact. Included paranasal sinuses and mastoid air cells are well
aerated.
IMPRESSION: No acute intracranial abnormality.

These results were called by telephone at the time of interpretation
on 11/16/2014 at [DATE] to Dr. Jaylon Aujla, who verbally
acknowledged these results.
